# Patient Record
Sex: Female | Born: 1950 | Race: White | Marital: Married | Smoking: Former smoker
Health system: Southern US, Community
[De-identification: ages and names within clinical notes are randomized; demographics above are authoritative.]

## PROBLEM LIST (undated history)

## (undated) DIAGNOSIS — H269 Unspecified cataract: Secondary | ICD-10-CM

## (undated) DIAGNOSIS — E538 Deficiency of other specified B group vitamins: Secondary | ICD-10-CM

## (undated) DIAGNOSIS — J45909 Unspecified asthma, uncomplicated: Secondary | ICD-10-CM

## (undated) DIAGNOSIS — R7989 Other specified abnormal findings of blood chemistry: Secondary | ICD-10-CM

## (undated) DIAGNOSIS — K219 Gastro-esophageal reflux disease without esophagitis: Secondary | ICD-10-CM

## (undated) DIAGNOSIS — D649 Anemia, unspecified: Secondary | ICD-10-CM

## (undated) DIAGNOSIS — E119 Type 2 diabetes mellitus without complications: Secondary | ICD-10-CM

## (undated) DIAGNOSIS — E559 Vitamin D deficiency, unspecified: Secondary | ICD-10-CM

## (undated) DIAGNOSIS — G2581 Restless legs syndrome: Secondary | ICD-10-CM

## (undated) DIAGNOSIS — D509 Iron deficiency anemia, unspecified: Secondary | ICD-10-CM

## (undated) DIAGNOSIS — G250 Essential tremor: Secondary | ICD-10-CM

## (undated) DIAGNOSIS — I10 Essential (primary) hypertension: Secondary | ICD-10-CM

## (undated) DIAGNOSIS — Z87442 Personal history of urinary calculi: Secondary | ICD-10-CM

## (undated) DIAGNOSIS — G709 Myoneural disorder, unspecified: Secondary | ICD-10-CM

## (undated) DIAGNOSIS — E079 Disorder of thyroid, unspecified: Secondary | ICD-10-CM

## (undated) DIAGNOSIS — T7840XA Allergy, unspecified, initial encounter: Secondary | ICD-10-CM

## (undated) HISTORY — DX: Unspecified asthma, uncomplicated: J45.909

## (undated) HISTORY — DX: Essential (primary) hypertension: I10

## (undated) HISTORY — DX: Allergy, unspecified, initial encounter: T78.40XA

## (undated) HISTORY — DX: Gastro-esophageal reflux disease without esophagitis: K21.9

## (undated) HISTORY — DX: Disorder of thyroid, unspecified: E07.9

## (undated) HISTORY — PX: FOOT SURGERY: SHX648

## (undated) HISTORY — DX: Personal history of urinary calculi: Z87.442

## (undated) HISTORY — DX: Myoneural disorder, unspecified: G70.9

## (undated) HISTORY — DX: Essential tremor: G25.0

## (undated) HISTORY — DX: Deficiency of other specified B group vitamins: E53.8

## (undated) HISTORY — DX: Anemia, unspecified: D64.9

## (undated) HISTORY — DX: Vitamin D deficiency, unspecified: E55.9

## (undated) HISTORY — DX: Unspecified cataract: H26.9

## (undated) HISTORY — DX: Iron deficiency anemia, unspecified: D50.9

## (undated) HISTORY — DX: Type 2 diabetes mellitus without complications: E11.9

## (undated) HISTORY — PX: BREAST EXCISIONAL BIOPSY: SUR124

## (undated) HISTORY — DX: Other specified abnormal findings of blood chemistry: R79.89

## (undated) HISTORY — PX: CHOLECYSTECTOMY: SHX55

## (undated) HISTORY — PX: ABDOMINAL HYSTERECTOMY: SHX81

## (undated) HISTORY — DX: Restless legs syndrome: G25.81

---

## 2010-09-10 DEATH — deceased

## 2012-10-11 HISTORY — PX: GASTRIC BYPASS: SHX52

## 2014-05-31 DIAGNOSIS — R3129 Other microscopic hematuria: Secondary | ICD-10-CM | POA: Insufficient documentation

## 2014-05-31 DIAGNOSIS — D126 Benign neoplasm of colon, unspecified: Secondary | ICD-10-CM | POA: Insufficient documentation

## 2014-05-31 DIAGNOSIS — G2581 Restless legs syndrome: Secondary | ICD-10-CM | POA: Insufficient documentation

## 2014-05-31 DIAGNOSIS — H905 Unspecified sensorineural hearing loss: Secondary | ICD-10-CM | POA: Insufficient documentation

## 2014-05-31 DIAGNOSIS — E559 Vitamin D deficiency, unspecified: Secondary | ICD-10-CM | POA: Insufficient documentation

## 2014-05-31 DIAGNOSIS — K912 Postsurgical malabsorption, not elsewhere classified: Secondary | ICD-10-CM | POA: Insufficient documentation

## 2014-05-31 DIAGNOSIS — N3946 Mixed incontinence: Secondary | ICD-10-CM | POA: Insufficient documentation

## 2014-05-31 DIAGNOSIS — R931 Abnormal findings on diagnostic imaging of heart and coronary circulation: Secondary | ICD-10-CM | POA: Insufficient documentation

## 2014-05-31 DIAGNOSIS — Z9884 Bariatric surgery status: Secondary | ICD-10-CM | POA: Insufficient documentation

## 2014-05-31 DIAGNOSIS — N319 Neuromuscular dysfunction of bladder, unspecified: Secondary | ICD-10-CM | POA: Insufficient documentation

## 2014-05-31 DIAGNOSIS — K7689 Other specified diseases of liver: Secondary | ICD-10-CM | POA: Insufficient documentation

## 2014-05-31 DIAGNOSIS — N201 Calculus of ureter: Secondary | ICD-10-CM | POA: Insufficient documentation

## 2014-05-31 DIAGNOSIS — L900 Lichen sclerosus et atrophicus: Secondary | ICD-10-CM | POA: Insufficient documentation

## 2014-05-31 DIAGNOSIS — G4700 Insomnia, unspecified: Secondary | ICD-10-CM | POA: Insufficient documentation

## 2014-07-31 DIAGNOSIS — G250 Essential tremor: Secondary | ICD-10-CM | POA: Insufficient documentation

## 2014-09-26 DIAGNOSIS — K629 Disease of anus and rectum, unspecified: Secondary | ICD-10-CM | POA: Insufficient documentation

## 2014-09-26 DIAGNOSIS — R159 Full incontinence of feces: Secondary | ICD-10-CM | POA: Insufficient documentation

## 2014-09-26 DIAGNOSIS — M4714 Other spondylosis with myelopathy, thoracic region: Secondary | ICD-10-CM | POA: Insufficient documentation

## 2014-09-26 DIAGNOSIS — K6289 Other specified diseases of anus and rectum: Secondary | ICD-10-CM

## 2014-10-16 DIAGNOSIS — E538 Deficiency of other specified B group vitamins: Secondary | ICD-10-CM | POA: Insufficient documentation

## 2015-05-13 DIAGNOSIS — M7989 Other specified soft tissue disorders: Secondary | ICD-10-CM | POA: Insufficient documentation

## 2017-03-25 DIAGNOSIS — N952 Postmenopausal atrophic vaginitis: Secondary | ICD-10-CM | POA: Insufficient documentation

## 2017-04-07 DIAGNOSIS — M1710 Unilateral primary osteoarthritis, unspecified knee: Secondary | ICD-10-CM | POA: Insufficient documentation

## 2017-04-07 DIAGNOSIS — E211 Secondary hyperparathyroidism, not elsewhere classified: Secondary | ICD-10-CM

## 2017-04-07 DIAGNOSIS — M179 Osteoarthritis of knee, unspecified: Secondary | ICD-10-CM | POA: Insufficient documentation

## 2017-04-08 ENCOUNTER — Other Ambulatory Visit: Payer: Self-pay | Admitting: Internal Medicine

## 2017-04-08 DIAGNOSIS — R221 Localized swelling, mass and lump, neck: Secondary | ICD-10-CM

## 2017-04-21 ENCOUNTER — Ambulatory Visit
Admission: RE | Admit: 2017-04-21 | Discharge: 2017-04-21 | Disposition: A | Discharge status: Home / Self Care | Payer: BLUE CROSS/BLUE SHIELD | Source: Ambulatory Visit | Attending: Internal Medicine | Admitting: Internal Medicine

## 2017-04-21 DIAGNOSIS — R221 Localized swelling, mass and lump, neck: Secondary | ICD-10-CM | POA: Insufficient documentation

## 2017-04-21 DIAGNOSIS — I6529 Occlusion and stenosis of unspecified carotid artery: Secondary | ICD-10-CM | POA: Diagnosis not present

## 2017-04-21 MED ORDER — IOPAMIDOL (ISOVUE-300) INJECTION 61%
75.0000 mL | Freq: Once | INTRAVENOUS | Status: AC | PRN
  Administered 2017-04-21: 75 mL via INTRAVENOUS

## 2017-04-22 DIAGNOSIS — R413 Other amnesia: Secondary | ICD-10-CM | POA: Insufficient documentation

## 2017-06-13 DIAGNOSIS — N6019 Diffuse cystic mastopathy of unspecified breast: Secondary | ICD-10-CM | POA: Insufficient documentation

## 2017-06-13 DIAGNOSIS — E785 Hyperlipidemia, unspecified: Secondary | ICD-10-CM

## 2017-06-13 DIAGNOSIS — I10 Essential (primary) hypertension: Secondary | ICD-10-CM | POA: Insufficient documentation

## 2017-06-13 DIAGNOSIS — E1169 Type 2 diabetes mellitus with other specified complication: Secondary | ICD-10-CM | POA: Insufficient documentation

## 2017-08-29 DIAGNOSIS — R251 Tremor, unspecified: Secondary | ICD-10-CM | POA: Insufficient documentation

## 2017-09-09 DIAGNOSIS — M25569 Pain in unspecified knee: Secondary | ICD-10-CM | POA: Insufficient documentation

## 2017-09-09 DIAGNOSIS — M2240 Chondromalacia patellae, unspecified knee: Secondary | ICD-10-CM | POA: Insufficient documentation

## 2017-09-09 DIAGNOSIS — M189 Osteoarthritis of first carpometacarpal joint, unspecified: Secondary | ICD-10-CM | POA: Insufficient documentation

## 2017-09-09 DIAGNOSIS — G5600 Carpal tunnel syndrome, unspecified upper limb: Secondary | ICD-10-CM | POA: Insufficient documentation

## 2017-09-09 DIAGNOSIS — M23359 Other meniscus derangements, posterior horn of lateral meniscus, unspecified knee: Secondary | ICD-10-CM | POA: Insufficient documentation

## 2017-09-09 DIAGNOSIS — M23329 Other meniscus derangements, posterior horn of medial meniscus, unspecified knee: Secondary | ICD-10-CM | POA: Insufficient documentation

## 2017-09-09 DIAGNOSIS — M545 Low back pain, unspecified: Secondary | ICD-10-CM | POA: Insufficient documentation

## 2017-09-09 DIAGNOSIS — M1991 Primary osteoarthritis, unspecified site: Secondary | ICD-10-CM | POA: Insufficient documentation

## 2017-12-13 ENCOUNTER — Encounter: Payer: Self-pay | Admitting: Oncology

## 2017-12-13 ENCOUNTER — Inpatient Hospital Stay: Payer: BLUE CROSS/BLUE SHIELD | Attending: Oncology | Admitting: Oncology

## 2017-12-13 ENCOUNTER — Inpatient Hospital Stay: Payer: BLUE CROSS/BLUE SHIELD

## 2017-12-13 VITALS — BP 140/85 | HR 76 | Temp 97.5°F | Wt 176.0 lb

## 2017-12-13 DIAGNOSIS — D509 Iron deficiency anemia, unspecified: Secondary | ICD-10-CM

## 2017-12-13 DIAGNOSIS — R5383 Other fatigue: Secondary | ICD-10-CM | POA: Insufficient documentation

## 2017-12-13 DIAGNOSIS — R634 Abnormal weight loss: Secondary | ICD-10-CM | POA: Diagnosis not present

## 2017-12-13 DIAGNOSIS — E538 Deficiency of other specified B group vitamins: Secondary | ICD-10-CM | POA: Insufficient documentation

## 2017-12-13 DIAGNOSIS — D649 Anemia, unspecified: Secondary | ICD-10-CM

## 2017-12-13 HISTORY — DX: Iron deficiency anemia, unspecified: D50.9

## 2017-12-13 LAB — CBC WITH DIFFERENTIAL/PLATELET
Basophils Absolute: 0.1 10*3/uL (ref 0–0.1)
Basophils Relative: 2 %
Eosinophils Absolute: 0.3 10*3/uL (ref 0–0.7)
Eosinophils Relative: 7 %
HCT: 31.6 % — ABNORMAL LOW (ref 35.0–47.0)
HEMOGLOBIN: 9.8 g/dL — AB (ref 12.0–16.0)
LYMPHS PCT: 48 %
Lymphs Abs: 2 10*3/uL (ref 1.0–3.6)
MCH: 21 pg — ABNORMAL LOW (ref 26.0–34.0)
MCHC: 30.9 g/dL — ABNORMAL LOW (ref 32.0–36.0)
MCV: 67.8 fL — AB (ref 80.0–100.0)
Monocytes Absolute: 0.2 10*3/uL (ref 0.2–0.9)
Monocytes Relative: 5 %
NEUTROS PCT: 38 %
Neutro Abs: 1.6 10*3/uL (ref 1.4–6.5)
Platelets: 346 10*3/uL (ref 150–440)
RBC: 4.66 MIL/uL (ref 3.80–5.20)
RDW: 19 % — ABNORMAL HIGH (ref 11.5–14.5)
WBC: 4.3 10*3/uL (ref 3.6–11.0)

## 2017-12-13 LAB — COMPREHENSIVE METABOLIC PANEL
ALK PHOS: 103 U/L (ref 38–126)
ALT: 15 U/L (ref 14–54)
AST: 22 U/L (ref 15–41)
Albumin: 4.2 g/dL (ref 3.5–5.0)
Anion gap: 8 (ref 5–15)
BUN: 13 mg/dL (ref 6–20)
CO2: 22 mmol/L (ref 22–32)
CREATININE: 0.63 mg/dL (ref 0.44–1.00)
Calcium: 9.2 mg/dL (ref 8.9–10.3)
Chloride: 107 mmol/L (ref 101–111)
Glucose, Bld: 129 mg/dL — ABNORMAL HIGH (ref 65–99)
Potassium: 3.7 mmol/L (ref 3.5–5.1)
SODIUM: 137 mmol/L (ref 135–145)
Total Bilirubin: 0.3 mg/dL (ref 0.3–1.2)
Total Protein: 8.1 g/dL (ref 6.5–8.1)

## 2017-12-13 LAB — VITAMIN B12: Vitamin B-12: 350 pg/mL (ref 180–914)

## 2017-12-13 NOTE — Progress Notes (Signed)
Hematology/Oncology Consult note [facility] Telephone:([phone] [phone]   Patient Care Team: Adin Hector, MD as PCP - General (Internal Medicine)  REFERRING PROVIDER: Adin Hector, MD CHIEF COMPLAINTS/PURPOSE OF CONSULTATION:  Evaluation of iron deficiency anemia  HISTORY OF PRESENTING ILLNESS:  Carla Estrada is a  67 y.o.  female with PMH listed below who was referred to me for evaluation of iron deficiency anemia  Hemoglobin 9.2, MCV 70.5, WBC 5.6, platelet 358,000, basophil 0.11(H), eosinophil 7.7 (H). Ferritin was checked on 08/04/2018 and was low at 5, Patient can not tolerate oral iron supplementation. 12/01/2017 UA was negative for hematuria.  She reports that she has had colonoscopy done with [facility] and was told that she has precancerous process, (?polyps) and she was advised to follow-up in 5 years.  Denies seeing any blood in the stool.  Patient reports feeling extremely fatigued, shortness of breath, lack of energy.  Craves ice chips. 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  She does not know much of her family history because she was raised a child home. Positive for 4-5 pounds of weight loss lately.  No appetite.  Denies any abdominal pain She has a history of B12 deficiency and she get subcutaneous B12 shots prescribed by her primary care physician.  Review of Systems  Constitutional: Positive for malaise/fatigue and weight loss. Negative for chills and fever.  HENT: Negative for congestion and nosebleeds.   Eyes: Negative for pain.  Respiratory: Negative for cough, sputum production and shortness of breath.   Cardiovascular: Negative for chest pain and leg swelling.  Gastrointestinal: Positive for heartburn. Negative for abdominal pain, blood in stool, constipation, nausea and vomiting.  Genitourinary: Negative for dysuria.  Musculoskeletal: Negative for myalgias  and neck pain.  Skin: Negative for rash.  Neurological: Negative for dizziness and tingling.  Psychiatric/Behavioral: Negative for depression and substance abuse.    MEDICAL HISTORY:  Past Medical History:  Diagnosis Date  . Allergy   . Anemia   . Asthma   . Benign head tremor   . Diabetes mellitus without complication ([place])   . GERD (gastroesophageal reflux disease)   . History of kidney stones   . Hypertension   . Iron deficiency anemia 12/13/2017  . Low vitamin B12 level   . Neuromuscular disorder ([place])   . Restless legs   . Thyroid disease   . Vitamin D deficiency     SURGICAL HISTORY: Past Surgical History:  Procedure Laterality Date  . ABDOMINAL HYSTERECTOMY    . CHOLECYSTECTOMY    . FOOT SURGERY    . FOOT SURGERY    . GASTRIC BYPASS  2014    SOCIAL HISTORY: Social History   Socioeconomic History  . Marital status: Married    Spouse name: Not on file  . Number of children: Not on file  . Years of education: Not on file  . Highest education level: Not on file  Social Needs  . Financial resource strain: Not on file  . Food insecurity - worry: Not on file  . Food insecurity - inability: Not on file  . Transportation needs - medical: Not on file  . Transportation needs - non-medical: Not on file  Occupational History  . Not on file  Tobacco Use  . Smoking status: Former Smoker    Packs/day: 0.25    Years: 20.00    Pack years: 5.00  Types: Cigarettes  . Smokeless tobacco: Never Used  Substance and Sexual Activity  . Alcohol use: No    Frequency: Never  . Drug use: No  . Sexual activity: Not on file  Other Topics Concern  . Not on file  Social History Narrative  . Not on file    FAMILY HISTORY: Family History  Problem Relation Age of Onset  . Diabetes Mother   . Heart attack Father   . Heart attack Brother   . Breast cancer Maternal Aunt     ALLERGIES:  has no allergies on file.  MEDICATIONS:  Current Outpatient Medications    Medication Sig Dispense Refill  . albuterol (PROVENTIL HFA) 108 (90 Base) MCG/ACT inhaler Inhale into the lungs.    . betamethasone dipropionate (DIPROLENE) 0.05 % ointment betamethasone dipropionate 0.05 % topical ointment    . clonazePAM (KLONOPIN) 0.5 MG tablet Take 0.5 mg by mouth 3 (three) times daily as needed.    . Cyanocobalamin (B-12 COMPLIANCE INJECTION IJ) Inject 1,000 mcg into the muscle every 30 (thirty) days.    . Eszopiclone 3 MG TABS Take 3 mg by mouth daily as needed.    Marland Kitchen omeprazole (PRILOSEC) 20 MG capsule Take 20 mg by mouth as needed.    . pramipexole (MIRAPEX) 0.25 MG tablet Take 0.25 mg by mouth 3 (three) times daily.    . primidone (MYSOLINE) 50 MG tablet Take 50 mg by mouth 2 (two) times daily.    . promethazine (PHENERGAN) 25 MG tablet Take 25 mg by mouth as needed.    . Vitamin D, Ergocalciferol, (DRISDOL) 50000 units CAPS capsule Take 50,000 Units by mouth once a week.     No current facility-administered medications for this visit.      PHYSICAL EXAMINATION: ECOG PERFORMANCE STATUS: 1 - Symptomatic but completely ambulatory Vitals:   12/13/17 1017  BP: 140/85  Pulse: 76  Temp: (!) 97.5 F (36.4 C)   Filed Weights   12/13/17 1017  Weight: 176 lb (79.8 kg)    Physical Exam  Constitutional: She is oriented to person, place, and time and well-developed, well-nourished, and in no distress. No distress.  HENT:  Head: Normocephalic and atraumatic.  Mouth/Throat: No oropharyngeal exudate.  Eyes: EOM are normal. Pupils are equal, round, and reactive to light. Left eye exhibits no discharge. No scleral icterus.  Neck: Normal range of motion. Neck supple.  Cardiovascular: Normal rate and regular rhythm. Exam reveals no friction rub.  No murmur heard. Pulmonary/Chest: Effort normal and breath sounds normal. She has no wheezes. She has no rales.  Abdominal: Soft. Bowel sounds are normal. She exhibits no distension. There is no rebound.  Musculoskeletal:  Normal range of motion. She exhibits no edema.  Lymphadenopathy:    She has no cervical adenopathy.  Neurological: She is alert and oriented to person, place, and time. No cranial nerve deficit.  Skin: Skin is warm and dry. No erythema.  Psychiatric: Affect and judgment normal.     LABORATORY DATA:  I have reviewed the data as listed Lab Results  Component Value Date   WBC 4.3 12/13/2017   HGB 9.8 (L) 12/13/2017   HCT 31.6 (L) 12/13/2017   MCV 67.8 (L) 12/13/2017   PLT 346 12/13/2017   Recent Labs    12/13/17 1125  NA 137  K 3.7  CL 107  CO2 22  GLUCOSE 129*  BUN 13  CREATININE 0.63  CALCIUM 9.2  GFRNONAA >60  GFRAA >60  PROT 8.1  ALBUMIN  4.2  AST 22  ALT 15  ALKPHOS 103  BILITOT 0.3       ASSESSMENT & PLAN:  1. Iron deficiency anemia, unspecified iron deficiency anemia type   2. B12 deficiency   3. Weight loss   4. Fatigue associated with anemia    # Plan IV iron with Feraheme 510 mg weekly x2. Allergy reactions/infusion reaction including anaphylactic reaction discussed with patient. Patient voices understanding and willing to proceed.  # Check kidney and liver function, B12, celiac panel, intrinsic factor antibody, antiparietal antibody #Discussed with patient about the importance of GI workup given her iron deficiency.  Will refer to Dr. Vicente Males to discuss about need of repeat colonoscopy.  All questions were answered. The patient knows to call the clinic with any problems questions or concerns.  Return of visit: 4 weeks with repeat labs. Dr.Klein, thank you for this kind referral and the opportunity to participate in the care of this patient. A copy of today's note is routed to referring provider    Earlie Server, MD, PhD Hematology Oncology [facility] : [facility] Pager- [phone] 12/13/2017

## 2017-12-14 LAB — ANTI-PARIETAL ANTIBODY: Parietal Cell Antibody-IgG: 40.8 Units — ABNORMAL HIGH (ref 0.0–20.0)

## 2017-12-14 LAB — INTRINSIC FACTOR ANTIBODIES: INTRINSIC FACTOR: 1 [AU]/ml (ref 0.0–1.1)

## 2017-12-15 LAB — CELIAC PANEL 10
Antigliadin Abs, IgA: 4 units (ref 0–19)
ENDOMYSIAL ANTIBODY IGA: NEGATIVE
Gliadin IgG: 3 units (ref 0–19)
IGA: 252 mg/dL (ref 87–352)

## 2017-12-16 ENCOUNTER — Encounter: Payer: Self-pay | Admitting: Oncology

## 2017-12-19 ENCOUNTER — Other Ambulatory Visit: Payer: Self-pay | Admitting: Internal Medicine

## 2017-12-19 DIAGNOSIS — Z1231 Encounter for screening mammogram for malignant neoplasm of breast: Secondary | ICD-10-CM

## 2017-12-20 ENCOUNTER — Inpatient Hospital Stay: Payer: BLUE CROSS/BLUE SHIELD

## 2017-12-20 VITALS — BP 157/84 | HR 70 | Temp 96.1°F | Resp 18

## 2017-12-20 DIAGNOSIS — D509 Iron deficiency anemia, unspecified: Secondary | ICD-10-CM

## 2017-12-20 MED ORDER — SODIUM CHLORIDE 0.9 % IV SOLN
Freq: Once | INTRAVENOUS | Status: AC
  Administered 2017-12-20: 14:00:00 via INTRAVENOUS
  Filled 2017-12-20: qty 1000

## 2017-12-20 MED ORDER — FERUMOXYTOL INJECTION 510 MG/17 ML
510.0000 mg | Freq: Once | INTRAVENOUS | Status: AC
  Administered 2017-12-20: 510 mg via INTRAVENOUS
  Filled 2017-12-20: qty 17

## 2017-12-27 ENCOUNTER — Inpatient Hospital Stay: Payer: BLUE CROSS/BLUE SHIELD

## 2017-12-27 VITALS — BP 132/77 | HR 83 | Temp 96.9°F | Resp 18

## 2017-12-27 DIAGNOSIS — D509 Iron deficiency anemia, unspecified: Secondary | ICD-10-CM

## 2017-12-27 MED ORDER — SODIUM CHLORIDE 0.9 % IV SOLN
Freq: Once | INTRAVENOUS | Status: AC
  Administered 2017-12-27: 14:00:00 via INTRAVENOUS
  Filled 2017-12-27: qty 1000

## 2017-12-27 MED ORDER — FERUMOXYTOL INJECTION 510 MG/17 ML
510.0000 mg | Freq: Once | INTRAVENOUS | Status: AC
  Administered 2017-12-27: 510 mg via INTRAVENOUS
  Filled 2017-12-27: qty 17

## 2017-12-28 ENCOUNTER — Encounter: Payer: Self-pay | Admitting: Gastroenterology

## 2017-12-28 ENCOUNTER — Ambulatory Visit (INDEPENDENT_AMBULATORY_CARE_PROVIDER_SITE_OTHER): Payer: BLUE CROSS/BLUE SHIELD | Admitting: Gastroenterology

## 2017-12-28 VITALS — BP 148/83 | HR 73 | Ht 62.5 in | Wt 180.2 lb

## 2017-12-28 DIAGNOSIS — D509 Iron deficiency anemia, unspecified: Secondary | ICD-10-CM

## 2017-12-28 DIAGNOSIS — E119 Type 2 diabetes mellitus without complications: Secondary | ICD-10-CM | POA: Insufficient documentation

## 2017-12-28 DIAGNOSIS — IMO0001 Reserved for inherently not codable concepts without codable children: Secondary | ICD-10-CM | POA: Insufficient documentation

## 2017-12-28 NOTE — Progress Notes (Signed)
Jonathon Bellows MD, MRCP(U.K) [address]  [place]  [place], [place] [address]  Main: [phone]  Fax: [phone]   Gastroenterology Consultation  Referring Provider:     Earlie Server, MD Primary Care Physician:  Adin Hector, MD Primary Gastroenterologist:  Dr. Jonathon Bellows  Reason for Consultation:     Iron deficiency anemia         HPI:   Carla Estrada is a 67 y.o. y/o female referred for iron deficiency anemia. She has been referred by Dr Tasia Catchings. MCV 70 with a ferritin in 07/2017 of 5, receiving IV iron . Last colonoscopy was told she had polyps. She is also b12 deficienct.  Celiac serology negative. Anti parietal cell antibody positive. She says that she has had anemia since she was a child.  She has had a gastric bypass- Roux en y. In 2011 Hb was 13. 2. No blood in urine in 11/2017 .  Rectal bleeding: no  Nose bleeds: no  Vaginal bleeding : no  Hematemesis or hemoptysis : no  Blood in urine : no   Last colonoscopy was 4 years back - "always had polyps" . Not raised with her family and hence no family history available.  CBC Latest Ref Rng & Units 12/13/2017  WBC 3.6 - 11.0 K/uL 4.3  Hemoglobin 12.0 - 16.0 g/dL 9.8(L)  Hematocrit 35.0 - 47.0 % 31.6(L)  Platelets 150 - 440 K/uL 346     Past Medical History:  Diagnosis Date  . Allergy   . Anemia   . Asthma   . Benign head tremor   . Diabetes mellitus without complication ([place])   . GERD (gastroesophageal reflux disease)   . History of kidney stones   . Hypertension   . Iron deficiency anemia 12/13/2017  . Low vitamin B12 level   . Neuromuscular disorder ([place])   . Restless legs   . Thyroid disease   . Vitamin D deficiency     Past Surgical History:  Procedure Laterality Date  . ABDOMINAL HYSTERECTOMY    . CHOLECYSTECTOMY    . FOOT SURGERY    . FOOT SURGERY    . GASTRIC BYPASS  2014    Prior to Admission medications   Medication Sig Start Date End Date Taking? Authorizing Provider  albuterol  (PROVENTIL HFA) 108 (90 Base) MCG/ACT inhaler Inhale into the lungs. 12/06/17   [provider]  betamethasone dipropionate (DIPROLENE) 0.05 % ointment betamethasone dipropionate 0.05 % topical ointment 11/10/16   [provider]  clonazePAM (KLONOPIN) 0.5 MG tablet Take 0.5 mg by mouth 3 (three) times daily as needed. 07/04/17   [provider]  Cyanocobalamin (B-12 COMPLIANCE INJECTION IJ) Inject 1,000 mcg into the muscle every 30 (thirty) days. 11/10/16   [provider]  Eszopiclone 3 MG TABS Take 3 mg by mouth daily as needed. 10/05/17 10/05/18  [provider]  omeprazole (PRILOSEC) 20 MG capsule Take 20 mg by mouth as needed. 01/10/17   [provider]  pramipexole (MIRAPEX) 0.25 MG tablet Take 0.25 mg by mouth 3 (three) times daily. 07/15/17   [provider]  primidone (MYSOLINE) 50 MG tablet Take 50 mg by mouth 2 (two) times daily. 11/10/16   [provider]  promethazine (PHENERGAN) 25 MG tablet Take 25 mg by mouth as needed. 11/10/16   [provider]  Vitamin D, Ergocalciferol, (DRISDOL) 50000 units CAPS capsule Take 50,000 Units by mouth once a week. 03/15/17 03/15/18  [provider]  Family History  Problem Relation Age of Onset  . Diabetes Mother   . Heart attack Father   . Heart attack Brother   . Breast cancer Maternal Aunt      Social History   Tobacco Use  . Smoking status: Former Smoker    Packs/day: 0.25    Years: 20.00    Pack years: 5.00    Types: Cigarettes  . Smokeless tobacco: Never Used  Substance Use Topics  . Alcohol use: No    Frequency: Never  . Drug use: No    Allergies as of 12/28/2017 - Review Complete 12/13/2017  Allergen Reaction Noted  . Oxycodone-acetaminophen Nausea And Vomiting 02/01/2011  . Liraglutide Nausea And Vomiting 02/01/2011  . Other Nausea And Vomiting 02/01/2011  . Gabapentin Other (See Comments) 09/09/2017  . Exenatide Nausea Only and Nausea And  Vomiting 05/27/2014  . Zoledronic acid Nausea Only 09/26/2014    Review of Systems:    All systems reviewed and negative except where noted in HPI.   Physical Exam:  BP (!) 148/83 (BP Location: Left Arm, Patient Position: Sitting, Cuff Size: Large)   Pulse 73   Ht 5' 2.5" (1.588 m)   Wt 180 lb 3.2 oz (81.7 kg)   BMI 32.43 kg/m  No LMP recorded. Psych:  Alert and cooperative. Normal mood and affect. General:   Alert,  Well-developed, well-nourished, pleasant and cooperative in NAD Head:  Normocephalic and atraumatic. Eyes:  Sclera clear, no icterus.   Conjunctiva pink. Ears:  Normal auditory acuity. Nose:  No deformity, discharge, or lesions. Mouth:  No deformity or lesions,oropharynx pink & moist. Neck:  Supple; no masses or thyromegaly. Lungs:  Respirations even and unlabored.  Clear throughout to auscultation.   No wheezes, crackles, or rhonchi. No acute distress. Heart:  Regular rate and rhythm; no murmurs, clicks, rubs, or gallops. Abdomen:  Normal bowel sounds.  No bruits.  Soft, non-tender and non-distended without masses, hepatosplenomegaly or hernias noted.  No guarding or rebound tenderness.    Msk:  Symmetrical without gross deformities. Good, equal movement & strength bilaterally. Pulses:  Normal pulses noted. Extremities:  No clubbing or edema.  No cyanosis. Neurologic:  Alert and oriented x3;  grossly normal neurologically. Skin:  Intact without significant lesions or rashes. No jaundice. Lymph Nodes:  No significant cervical adenopathy. Psych:  Alert and cooperative. Normal mood and affect.  Imaging Studies: No results found.  Assessment and Plan:   Carla Estrada is a 67 y.o. y/o female has been referred for iron deficiency anemia. 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    Plan  1. EGD+colonoscopy+/- capsule study    I have discussed alternative options, risks & benefits,  which include, but are not limited to, bleeding,  infection, perforation,respiratory complication & drug reaction.  The patient agrees with this plan & written consent will be obtained.     Follow up in 8 weeks   Dr Jonathon Bellows MD,MRCP(U.K)

## 2017-12-28 NOTE — Addendum Note (Signed)
Addended by: Leontine Locket Z on: 12/28/2017 11:40 AM   Modules accepted: Orders, SmartSet

## 2018-01-03 ENCOUNTER — Encounter: Payer: Self-pay | Admitting: Radiology

## 2018-01-03 ENCOUNTER — Ambulatory Visit
Admission: RE | Admit: 2018-01-03 | Discharge: 2018-01-03 | Disposition: A | Discharge status: Home / Self Care | Payer: BLUE CROSS/BLUE SHIELD | Source: Ambulatory Visit | Attending: Internal Medicine | Admitting: Internal Medicine

## 2018-01-03 DIAGNOSIS — Z1231 Encounter for screening mammogram for malignant neoplasm of breast: Secondary | ICD-10-CM | POA: Insufficient documentation

## 2018-01-04 ENCOUNTER — Encounter: Payer: Self-pay | Admitting: *Deleted

## 2018-01-05 ENCOUNTER — Encounter: Payer: Self-pay | Admitting: Anesthesiology

## 2018-01-05 ENCOUNTER — Encounter
Admission: RE | Disposition: A | Discharge status: Home / Self Care | Payer: Self-pay | Source: Ambulatory Visit | Attending: Gastroenterology

## 2018-01-05 ENCOUNTER — Ambulatory Visit: Payer: BLUE CROSS/BLUE SHIELD | Admitting: Anesthesiology

## 2018-01-05 ENCOUNTER — Ambulatory Visit
Admission: RE | Admit: 2018-01-05 | Discharge: 2018-01-05 | Disposition: A | Discharge status: Home / Self Care | Payer: BLUE CROSS/BLUE SHIELD | Source: Ambulatory Visit | Attending: Gastroenterology | Admitting: Gastroenterology

## 2018-01-05 DIAGNOSIS — D509 Iron deficiency anemia, unspecified: Secondary | ICD-10-CM | POA: Insufficient documentation

## 2018-01-05 DIAGNOSIS — E119 Type 2 diabetes mellitus without complications: Secondary | ICD-10-CM | POA: Insufficient documentation

## 2018-01-05 DIAGNOSIS — E079 Disorder of thyroid, unspecified: Secondary | ICD-10-CM | POA: Insufficient documentation

## 2018-01-05 DIAGNOSIS — Z87891 Personal history of nicotine dependence: Secondary | ICD-10-CM | POA: Diagnosis not present

## 2018-01-05 DIAGNOSIS — K210 Gastro-esophageal reflux disease with esophagitis: Secondary | ICD-10-CM | POA: Insufficient documentation

## 2018-01-05 DIAGNOSIS — J45909 Unspecified asthma, uncomplicated: Secondary | ICD-10-CM | POA: Diagnosis not present

## 2018-01-05 DIAGNOSIS — D123 Benign neoplasm of transverse colon: Secondary | ICD-10-CM

## 2018-01-05 DIAGNOSIS — I10 Essential (primary) hypertension: Secondary | ICD-10-CM | POA: Diagnosis not present

## 2018-01-05 DIAGNOSIS — Z9884 Bariatric surgery status: Secondary | ICD-10-CM | POA: Diagnosis not present

## 2018-01-05 DIAGNOSIS — G2581 Restless legs syndrome: Secondary | ICD-10-CM | POA: Insufficient documentation

## 2018-01-05 DIAGNOSIS — E559 Vitamin D deficiency, unspecified: Secondary | ICD-10-CM | POA: Diagnosis not present

## 2018-01-05 DIAGNOSIS — Z87442 Personal history of urinary calculi: Secondary | ICD-10-CM | POA: Insufficient documentation

## 2018-01-05 DIAGNOSIS — Z79899 Other long term (current) drug therapy: Secondary | ICD-10-CM | POA: Insufficient documentation

## 2018-01-05 DIAGNOSIS — D124 Benign neoplasm of descending colon: Secondary | ICD-10-CM | POA: Insufficient documentation

## 2018-01-05 HISTORY — PX: COLONOSCOPY WITH PROPOFOL: SHX5780

## 2018-01-05 HISTORY — PX: ESOPHAGOGASTRODUODENOSCOPY (EGD) WITH PROPOFOL: SHX5813

## 2018-01-05 SURGERY — ESOPHAGOGASTRODUODENOSCOPY (EGD) WITH PROPOFOL
Anesthesia: General

## 2018-01-05 MED ORDER — FENTANYL CITRATE (PF) 100 MCG/2ML IJ SOLN
INTRAMUSCULAR | Status: AC
  Filled 2018-01-05: qty 2

## 2018-01-05 MED ORDER — PROPOFOL 10 MG/ML IV BOLUS
INTRAVENOUS | Status: AC
  Filled 2018-01-05: qty 20

## 2018-01-05 MED ORDER — SODIUM CHLORIDE 0.9 % IV SOLN
INTRAVENOUS | Status: DC
  Administered 2018-01-05: 1000 mL via INTRAVENOUS

## 2018-01-05 MED ORDER — FENTANYL CITRATE (PF) 100 MCG/2ML IJ SOLN
INTRAMUSCULAR | Status: DC | PRN
  Administered 2018-01-05: 50 ug via INTRAVENOUS

## 2018-01-05 MED ORDER — PROPOFOL 10 MG/ML IV BOLUS
INTRAVENOUS | Status: AC
  Filled 2018-01-05: qty 40

## 2018-01-05 MED ORDER — PROPOFOL 10 MG/ML IV BOLUS
INTRAVENOUS | Status: DC | PRN
  Administered 2018-01-05: 100 mg via INTRAVENOUS

## 2018-01-05 MED ORDER — PROPOFOL 500 MG/50ML IV EMUL
INTRAVENOUS | Status: DC | PRN
  Administered 2018-01-05: 125 ug/kg/min via INTRAVENOUS

## 2018-01-05 NOTE — Anesthesia Postprocedure Evaluation (Signed)
Anesthesia Post Note  Patient: Carla Estrada  Procedure(s) Performed: ESOPHAGOGASTRODUODENOSCOPY (EGD) WITH PROPOFOL (N/A ) COLONOSCOPY WITH PROPOFOL (N/A )  Patient location during evaluation: Endoscopy Anesthesia Type: General Level of consciousness: awake and alert Pain management: pain level controlled Vital Signs Assessment: post-procedure vital signs reviewed and stable Respiratory status: spontaneous breathing, nonlabored ventilation, respiratory function stable and patient connected to nasal cannula oxygen Cardiovascular status: blood pressure returned to baseline and stable Postop Assessment: no apparent nausea or vomiting Anesthetic complications: no     Last Vitals:  Vitals:   01/05/18 1119 01/05/18 1129  BP: (!) 83/56   Pulse: 69 68  Resp: 18 14  Temp: (!) 36.2 C   SpO2: 96% 99%    Last Pain:  Vitals:   01/05/18 1139  TempSrc:   PainSc: 0-No pain                 Kiva Norland S

## 2018-01-05 NOTE — Op Note (Signed)
[facility] Gastroenterology Patient Name: Carla Estrada Procedure Date: 01/05/2018 10:42 AM MRN: [id number] Account #: [id number] Date of Birth: November 15, 1950 Admit Type: Outpatient Age: 67 Room: [facility] ENDO ROOM 4 Gender: Female Note Status: Finalized Procedure:            Colonoscopy Indications:          Iron deficiency anemia Providers:            Jonathon Bellows MD, MD Referring MD:         Ramonita Lab, MD (Referring MD) Medicines:            Monitored Anesthesia Care Complications:        No immediate complications. Procedure:            Pre-Anesthesia Assessment:                       - Prior to the procedure, a History and Physical was                        performed, and patient medications, allergies and                        sensitivities were reviewed. The patient's tolerance of                        previous anesthesia was reviewed.                       - The risks and benefits of the procedure and the                        sedation options and risks were discussed with the                        patient. All questions were answered and informed                        consent was obtained.                       - ASA Grade Assessment: III - A patient with severe                        systemic disease.                       After obtaining informed consent, the colonoscope was                        passed under direct vision. Throughout the procedure,                        the patient's blood pressure, pulse, and oxygen                        saturations were monitored continuously. The                        Colonoscope was introduced through the anus and                        advanced  to the the cecum, identified by the                        appendiceal orifice, IC valve and transillumination.                        The colonoscopy was performed with ease. The patient                        tolerated the procedure well. The quality of the bowel                    preparation was good. Findings:      The perianal and digital rectal examinations were normal.      Four sessile polyps were found in the descending colon and transverse       colon. The polyps were 3 to 4 mm in size. These polyps were removed with       a cold biopsy forceps. Resection and retrieval were complete. 3 polyps       in the descending colon and one in the transverse colon      The exam was otherwise without abnormality on direct and retroflexion       views. Impression:           - Four 3 to 4 mm polyps in the descending colon and in                        the transverse colon, removed with a cold biopsy                        forceps. Resected and retrieved.                       - The examination was otherwise normal on direct and                        retroflexion views. Recommendation:       - Discharge patient to home (with escort).                       - Resume previous diet.                       - Continue present medications.                       - Await pathology results.                       - Repeat colonoscopy in 3 years for surveillance.                       - To visualize the small bowel, perform video capsule                        endoscopy in 2 weeks.                       - Return to my office as previously scheduled. Procedure Code(s):    --- Professional ---  45380, Colonoscopy, flexible; with biopsy, single or                        multiple Diagnosis Code(s):    --- Professional ---                       D12.4, Benign neoplasm of descending colon                       D12.3, Benign neoplasm of transverse colon (hepatic                        flexure or splenic flexure)                       D50.9, Iron deficiency anemia, unspecified CPT copyright 2016 American Medical Association. All rights reserved. The codes documented in this report are preliminary and upon coder review may  be revised to meet current  compliance requirements. Jonathon Bellows, MD Jonathon Bellows MD, MD 01/05/2018 11:18:19 AM This report has been signed electronically. Number of Addenda: 0 Note Initiated On: 01/05/2018 10:42 AM Scope Withdrawal Time: 0 hours 13 minutes 24 seconds  Total Procedure Duration: 0 hours 17 minutes 38 seconds       [facility]

## 2018-01-05 NOTE — Anesthesia Preprocedure Evaluation (Signed)
Anesthesia Evaluation  Patient identified by MRN, date of birth, ID band Patient awake    Reviewed: Allergy & Precautions, NPO status , Patient's Chart, lab work & pertinent test results, reviewed documented beta blocker date and time   Airway Mallampati: II  TM Distance: >3 FB     Dental  (+) Chipped   Pulmonary asthma , former smoker,           Cardiovascular hypertension, Pt. on medications      Neuro/Psych  Neuromuscular disease    GI/Hepatic GERD  ,  Endo/Other  diabetes, Type 2  Renal/GU      Musculoskeletal  (+) Arthritis ,   Abdominal   Peds  Hematology  (+) anemia ,   Anesthesia Other Findings   Reproductive/Obstetrics                             Anesthesia Physical Anesthesia Plan  ASA: III  Anesthesia Plan: General   Post-op Pain Management:    Induction: Intravenous  PONV Risk Score and Plan:   Airway Management Planned:   Additional Equipment:   Intra-op Plan:   Post-operative Plan:   Informed Consent: I have reviewed the patients History and Physical, chart, labs and discussed the procedure including the risks, benefits and alternatives for the proposed anesthesia with the patient or authorized representative who has indicated his/her understanding and acceptance.     Plan Discussed with: CRNA  Anesthesia Plan Comments:         Anesthesia Quick Evaluation

## 2018-01-05 NOTE — Transfer of Care (Signed)
Immediate Anesthesia Transfer of Care Note  Patient: Carla Estrada  Procedure(s) Performed: ESOPHAGOGASTRODUODENOSCOPY (EGD) WITH PROPOFOL (N/A ) COLONOSCOPY WITH PROPOFOL (N/A )  Patient Location: PACU  Anesthesia Type:General  Level of Consciousness: awake and alert   Airway & Oxygen Therapy: Patient Spontanous Breathing  Post-op Assessment: Report given to RN  Post vital signs: Reviewed and stable  Last Vitals:  Vitals Value Taken Time  BP    Temp    Pulse 69 01/05/2018 11:19 AM  Resp 21 01/05/2018 11:19 AM  SpO2 96 % 01/05/2018 11:19 AM  Vitals shown include unvalidated device data.  Last Pain:  Vitals:   01/05/18 0920  TempSrc: Tympanic  PainSc: 0-No pain         Complications: No apparent anesthesia complications

## 2018-01-05 NOTE — H&P (Signed)
Carla Bellows, MD [address], [place], [place], [place], [address] [address], [place], [place], [place], [address] Phone: [phone]  Fax: [phone]  Primary Care Physician:  Adin Hector, MD   Pre-Procedure History & Physical: HPI:  Carla Estrada is a 67 y.o. female is here for an endoscopy and colonoscopy    Past Medical History:  Diagnosis Date  . Allergy   . Anemia   . Asthma   . Benign head tremor   . Diabetes mellitus without complication ([place])   . GERD (gastroesophageal reflux disease)   . History of kidney stones   . Hypertension   . Iron deficiency anemia 12/13/2017  . Low vitamin B12 level   . Neuromuscular disorder ([place])   . Restless legs   . Thyroid disease   . Vitamin D deficiency     Past Surgical History:  Procedure Laterality Date  . ABDOMINAL HYSTERECTOMY    . BREAST EXCISIONAL BIOPSY Left   . CHOLECYSTECTOMY    . FOOT SURGERY    . FOOT SURGERY    . GASTRIC BYPASS  2014    Prior to Admission medications   Medication Sig Start Date End Date Taking? Authorizing Provider  albuterol (PROVENTIL HFA) 108 (90 Base) MCG/ACT inhaler Inhale into the lungs. 12/06/17  Yes [provider]  betamethasone dipropionate (DIPROLENE) 0.05 % ointment betamethasone dipropionate 0.05 % topical ointment 11/10/16  Yes [provider]  clonazePAM (KLONOPIN) 0.5 MG tablet Take 0.5 mg by mouth 3 (three) times daily as needed. 07/04/17  Yes [provider]  Cyanocobalamin (B-12 COMPLIANCE INJECTION IJ) Inject 1,000 mcg into the muscle every 30 (thirty) days. 11/10/16  Yes [provider]  Eszopiclone 3 MG TABS Take 3 mg by mouth daily as needed. 10/05/17 10/05/18 Yes [provider]  omeprazole (PRILOSEC) 20 MG capsule Take 20 mg by mouth as needed. 01/10/17  Yes [provider]  pramipexole (MIRAPEX) 0.25 MG tablet Take 0.25 mg by mouth 3 (three) times daily. 07/15/17  Yes [provider]  primidone  (MYSOLINE) 50 MG tablet Take 50 mg by mouth 2 (two) times daily. 11/10/16  Yes [provider]  promethazine (PHENERGAN) 25 MG tablet Take 25 mg by mouth as needed. 11/10/16  Yes [provider]  Vitamin D, Ergocalciferol, (DRISDOL) 50000 units CAPS capsule Take 50,000 Units by mouth once a week. 03/15/17 03/15/18 Yes [provider]    Allergies as of 12/29/2017 - Review Complete 12/28/2017  Allergen Reaction Noted  . Oxycodone-acetaminophen Nausea And Vomiting 02/01/2011  . Liraglutide Nausea And Vomiting 02/01/2011  . Other Nausea And Vomiting 02/01/2011  . Gabapentin Other (See Comments) 09/09/2017  . Exenatide Nausea Only and Nausea And Vomiting 05/27/2014  . Zoledronic acid Nausea Only 09/26/2014    Family History  Problem Relation Age of Onset  . Diabetes Mother   . Heart attack Father   . Heart attack Brother   . Breast cancer Paternal Aunt     Social History   Socioeconomic History  . Marital status: Married    Spouse name: Not on file  . Number of children: Not on file  . Years of education: Not on file  . Highest education level: Not on file  Occupational History  . Not on file  Social Needs  . Financial resource strain: Not on file  . Food insecurity:    Worry: Not on file    Inability: Not on file  . Transportation  needs:    Medical: Not on file    Non-medical: Not on file  Tobacco Use  . Smoking status: Former Smoker    Packs/day: 0.25    Years: 20.00    Pack years: 5.00    Types: Cigarettes  . Smokeless tobacco: Never Used  Substance and Sexual Activity  . Alcohol use: No    Frequency: Never  . Drug use: No  . Sexual activity: Yes  Lifestyle  . Physical activity:    Days per week: Not on file    Minutes per session: Not on file  . Stress: Not on file  Relationships  . Social connections:    Talks on phone: Not on file    Gets together: Not on file    Attends religious service: Not on file    Active member of club or  organization: Not on file    Attends meetings of clubs or organizations: Not on file    Relationship status: Not on file  . Intimate partner violence:    Fear of current or ex partner: Not on file    Emotionally abused: Not on file    Physically abused: Not on file    Forced sexual activity: Not on file  Other Topics Concern  . Not on file  Social History Narrative  . Not on file    Review of Systems: See HPI, otherwise negative ROS  Physical Exam: BP (!) 181/87   Pulse 69   Temp (!) 97 F (36.1 C) (Tympanic)   Resp 20   Ht 5' 2.5" (1.588 m)   Wt 176 lb (79.8 kg)   SpO2 100%   BMI 31.68 kg/m  General:   Alert,  pleasant and cooperative in NAD Head:  Normocephalic and atraumatic. Neck:  Supple; no masses or thyromegaly. Lungs:  Clear throughout to auscultation, normal respiratory effort.    Heart:  +S1, +S2, Regular rate and rhythm, No edema. Abdomen:  Soft, nontender and nondistended. Normal bowel sounds, without guarding, and without rebound.   Neurologic:  Alert and  oriented x4;  grossly normal neurologically.  Impression/Plan: Carla Estrada is here for an endoscopy and colonoscopy  to be performed for  evaluation of iron deficiency anemia     Risks, benefits, limitations, and alternatives regarding endoscopy have been reviewed with the patient.  Questions have been answered.  All parties agreeable.   Carla Bellows, MD  01/05/2018, 10:38 AM

## 2018-01-05 NOTE — Op Note (Signed)
[facility] Gastroenterology Patient Name: Carla Estrada Procedure Date: 01/05/2018 10:42 AM MRN: [id number] Account #: [id number] Date of Birth: 11-19-1950 Admit Type: Outpatient Age: 67 Room: [facility] ENDO ROOM 4 Gender: Female Note Status: Finalized Procedure:            Upper GI endoscopy Indications:          Iron deficiency anemia Providers:            Jonathon Bellows MD, MD Referring MD:         Ramonita Lab, MD (Referring MD) Medicines:            Monitored Anesthesia Care Complications:        No immediate complications. Procedure:            Pre-Anesthesia Assessment:                       - Prior to the procedure, a History and Physical was                        performed, and patient medications, allergies and                        sensitivities were reviewed. The patient's tolerance of                        previous anesthesia was reviewed.                       - The risks and benefits of the procedure and the                        sedation options and risks were discussed with the                        patient. All questions were answered and informed                        consent was obtained.                       - ASA Grade Assessment: III - A patient with severe                        systemic disease.                       After obtaining informed consent, the endoscope was                        passed under direct vision. Throughout the procedure,                        the patient's blood pressure, pulse, and oxygen                        saturations were monitored continuously. The Endoscope                        was introduced through the mouth, and advanced to the  jejunum. The upper GI endoscopy was accomplished with                        ease. The patient tolerated the procedure well. Findings:      The examined jejunum was normal.      Evidence of a gastric bypass was found. A gastric pouch was found. The   gastrojejunal anastomosis was characterized by healthy appearing mucosa.       This was traversed. The pouch-to-jejunum limb was characterized by       healthy appearing mucosa. The duodenum-to-jejunum limb was examined.      LA Grade A (one or more mucosal breaks less than 5 mm, not extending       between tops of 2 mucosal folds) esophagitis with no bleeding was found       at the gastroesophageal junction. Biopsies were taken with a cold       forceps for histology.      The cardia and gastric fundus were normal on retroflexion. Impression:           - Normal examined jejunum.                       - Gastric bypass. Gastrojejunal anastomosis                        characterized by healthy appearing mucosa.                       - LA Grade A reflux esophagitis. Biopsied. Recommendation:       - Await pathology results.                       - Perform a colonoscopy today. Procedure Code(s):    --- Professional ---                       [phone], Esophagogastroduodenoscopy, flexible, transoral;                        with biopsy, single or multiple Diagnosis Code(s):    --- Professional ---                       Z98.84, Bariatric surgery status                       K21.0, Gastro-esophageal reflux disease with esophagitis                       D50.9, Iron deficiency anemia, unspecified CPT copyright 2016 American Medical Association. All rights reserved. The codes documented in this report are preliminary and upon coder review may  be revised to meet current compliance requirements. Jonathon Bellows, MD Jonathon Bellows MD, MD 01/05/2018 10:56:15 AM This report has been signed electronically. Number of Addenda: 0 Note Initiated On: 01/05/2018 10:42 AM      [facility]

## 2018-01-05 NOTE — Anesthesia Post-op Follow-up Note (Signed)
Anesthesia QCDR form completed.        

## 2018-01-06 LAB — SURGICAL PATHOLOGY

## 2018-01-09 ENCOUNTER — Encounter: Payer: Self-pay | Admitting: Gastroenterology

## 2018-01-10 ENCOUNTER — Inpatient Hospital Stay
Admission: RE | Admit: 2018-01-10 | Discharge: 2018-01-10 | Disposition: A | Discharge status: Home / Self Care | Payer: Self-pay | Source: Ambulatory Visit | Attending: *Deleted | Admitting: *Deleted

## 2018-01-10 ENCOUNTER — Other Ambulatory Visit: Payer: Self-pay | Admitting: *Deleted

## 2018-01-10 DIAGNOSIS — Z9289 Personal history of other medical treatment: Secondary | ICD-10-CM

## 2018-01-11 ENCOUNTER — Inpatient Hospital Stay (HOSPITAL_BASED_OUTPATIENT_CLINIC_OR_DEPARTMENT_OTHER): Payer: BLUE CROSS/BLUE SHIELD | Admitting: Oncology

## 2018-01-11 ENCOUNTER — Encounter: Payer: Self-pay | Admitting: Oncology

## 2018-01-11 ENCOUNTER — Inpatient Hospital Stay: Payer: BLUE CROSS/BLUE SHIELD | Attending: Oncology

## 2018-01-11 VITALS — BP 145/85 | HR 80 | Temp 98.3°F | Resp 18 | Wt 179.0 lb

## 2018-01-11 DIAGNOSIS — K210 Gastro-esophageal reflux disease with esophagitis: Secondary | ICD-10-CM | POA: Insufficient documentation

## 2018-01-11 DIAGNOSIS — D509 Iron deficiency anemia, unspecified: Secondary | ICD-10-CM

## 2018-01-11 DIAGNOSIS — E538 Deficiency of other specified B group vitamins: Secondary | ICD-10-CM | POA: Insufficient documentation

## 2018-01-11 DIAGNOSIS — Z9484 Stem cells transplant status: Secondary | ICD-10-CM

## 2018-01-11 DIAGNOSIS — D508 Other iron deficiency anemias: Secondary | ICD-10-CM | POA: Insufficient documentation

## 2018-01-11 LAB — CBC WITH DIFFERENTIAL/PLATELET
BASOS ABS: 0.1 10*3/uL (ref 0–0.1)
BASOS PCT: 1 %
EOS PCT: 2 %
Eosinophils Absolute: 0.1 10*3/uL (ref 0–0.7)
HCT: 38.2 % (ref 35.0–47.0)
Hemoglobin: 12.8 g/dL (ref 12.0–16.0)
LYMPHS PCT: 41 %
Lymphs Abs: 2.2 10*3/uL (ref 1.0–3.6)
MCH: 25.3 pg — ABNORMAL LOW (ref 26.0–34.0)
MCHC: 33.5 g/dL (ref 32.0–36.0)
MCV: 75.5 fL — AB (ref 80.0–100.0)
Monocytes Absolute: 0.3 10*3/uL (ref 0.2–0.9)
Monocytes Relative: 5 %
Neutro Abs: 2.8 10*3/uL (ref 1.4–6.5)
Neutrophils Relative %: 51 %
PLATELETS: 363 10*3/uL (ref 150–440)
RBC: 5.06 MIL/uL (ref 3.80–5.20)
RDW: 30.3 % — ABNORMAL HIGH (ref 11.5–14.5)
WBC: 5.5 10*3/uL (ref 3.6–11.0)

## 2018-01-11 LAB — IRON AND TIBC
Iron: 72 ug/dL (ref 28–170)
Saturation Ratios: 19 % (ref 10.4–31.8)
TIBC: 382 ug/dL (ref 250–450)
UIBC: 310 ug/dL

## 2018-01-11 LAB — FERRITIN: Ferritin: 207 ng/mL (ref 11–307)

## 2018-01-11 NOTE — Progress Notes (Signed)
Pt in for follow up and labs today.  Pt reports "feeling very good, not tired or fatigued like I was before the treatments".

## 2018-01-11 NOTE — Progress Notes (Signed)
Hematology/Oncology Follow up note [facility] [phone] [phone]   Patient Care Team: Adin Hector, MD as PCP - General (Internal Medicine)  REFERRING PROVIDER: Adin Hector, MD CHIEF COMPLAINTS/PURPOSE OF CONSULTATION:  Evaluation of iron deficiency anemia  HISTORY OF PRESENTING ILLNESS:  Carla Estrada is a  67 y.o.  female with PMH listed below who was referred to me for evaluation of iron deficiency anemia  Hemoglobin 9.2, MCV 70.5, WBC 5.6, platelet 358,000, basophil 0.11(H), eosinophil 7.7 (H). Ferritin was checked on 08/04/2018 and was low at 5, Patient can not tolerate oral iron supplementation. 12/01/2017 UA was negative for hematuria.  She reports that she has had colonoscopy done with [facility] and was told that she has precancerous process, (?polyps) and she was advised to follow-up in 5 years.  Denies seeing any blood in the stool.  Patient reports feeling extremely fatigued, shortness of breath, lack of energy.  Craves ice chips. 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  She does not know much of her family history because she was raised a child home. Positive for 4-5 pounds of weight loss lately.  No appetite.  Denies any abdominal pain She has a history of B12 deficiency and she get subcutaneous B12 shots prescribed by her primary care physician  INTERVAL HISTORY Carla Estrada is a 67 y.o. female who has above history reviewed by me today presents for follow up visit for management of iron deficiency anemia.  Patient status post Feraheme weekly x2.  Reports energy has significantly improved.  Review of Systems  Constitutional: Positive for weight loss. Negative for chills, fever and malaise/fatigue.  HENT: Negative for congestion, nosebleeds and sore throat.   Eyes: Negative for photophobia and pain.  Respiratory: Negative for cough, sputum production and  shortness of breath.   Cardiovascular: Negative for chest pain, orthopnea and leg swelling.  Gastrointestinal: Positive for heartburn. Negative for abdominal pain, blood in stool, constipation, nausea and vomiting.  Genitourinary: Negative for dysuria, frequency and urgency.  Musculoskeletal: Negative for back pain, myalgias and neck pain.  Skin: Negative for rash.  Neurological: Negative for dizziness, tingling and speech change.  Psychiatric/Behavioral: Negative for depression, substance abuse and suicidal ideas.    MEDICAL HISTORY:  Past Medical History:  Diagnosis Date  . Allergy   . Anemia   . Asthma   . Benign head tremor   . Diabetes mellitus without complication ([place])   . GERD (gastroesophageal reflux disease)   . History of kidney stones   . Hypertension   . Iron deficiency anemia 12/13/2017  . Low vitamin B12 level   . Neuromuscular disorder ([place])   . Restless legs   . Thyroid disease   . Vitamin D deficiency     SURGICAL HISTORY: Past Surgical History:  Procedure Laterality Date  . ABDOMINAL HYSTERECTOMY    . BREAST EXCISIONAL BIOPSY Left   . CHOLECYSTECTOMY    . COLONOSCOPY WITH PROPOFOL N/A 01/05/2018   Procedure: COLONOSCOPY WITH PROPOFOL;  Surgeon: Jonathon Bellows, MD;  Location: [facility] ENDOSCOPY;  Service: Gastroenterology;  Laterality: N/A;  . ESOPHAGOGASTRODUODENOSCOPY (EGD) WITH PROPOFOL N/A 01/05/2018   Procedure: ESOPHAGOGASTRODUODENOSCOPY (EGD) WITH PROPOFOL;  Surgeon: Jonathon Bellows, MD;  Location: [facility] ENDOSCOPY;  Service: Gastroenterology;  Laterality: N/A;  . FOOT SURGERY    . FOOT SURGERY    . GASTRIC BYPASS  2014    SOCIAL HISTORY: Social History  Socioeconomic History  . Marital status: Married    Spouse name: Not on file  . Number of children: Not on file  . Years of education: Not on file  . Highest education level: Not on file  Occupational History  . Not on file  Social Needs  . Financial resource strain: Not on file  . Food insecurity:     Worry: Not on file    Inability: Not on file  . Transportation needs:    Medical: Not on file    Non-medical: Not on file  Tobacco Use  . Smoking status: Former Smoker    Packs/day: 0.25    Years: 20.00    Pack years: 5.00    Types: Cigarettes  . Smokeless tobacco: Never Used  Substance and Sexual Activity  . Alcohol use: No    Frequency: Never  . Drug use: No  . Sexual activity: Yes  Lifestyle  . Physical activity:    Days per week: Not on file    Minutes per session: Not on file  . Stress: Not on file  Relationships  . Social connections:    Talks on phone: Not on file    Gets together: Not on file    Attends religious service: Not on file    Active member of club or organization: Not on file    Attends meetings of clubs or organizations: Not on file    Relationship status: Not on file  . Intimate partner violence:    Fear of current or ex partner: Not on file    Emotionally abused: Not on file    Physically abused: Not on file    Forced sexual activity: Not on file  Other Topics Concern  . Not on file  Social History Narrative  . Not on file    FAMILY HISTORY: Family History  Problem Relation Age of Onset  . Diabetes Mother   . Heart attack Father   . Heart attack Brother   . Breast cancer Paternal Aunt     ALLERGIES:  is allergic to oxycodone-acetaminophen; liraglutide; other; gabapentin; exenatide; and zoledronic acid.  MEDICATIONS:  Current Outpatient Medications  Medication Sig Dispense Refill  . albuterol (PROVENTIL HFA) 108 (90 Base) MCG/ACT inhaler Inhale into the lungs.    . betamethasone dipropionate (DIPROLENE) 0.05 % ointment betamethasone dipropionate 0.05 % topical ointment    . clonazePAM (KLONOPIN) 0.5 MG tablet Take 0.5 mg by mouth 3 (three) times daily as needed.    . Eszopiclone 3 MG TABS Take 3 mg by mouth daily as needed.    Marland Kitchen omeprazole (PRILOSEC) 20 MG capsule Take 20 mg by mouth as needed.    . pramipexole (MIRAPEX) 0.25 MG tablet  Take 0.25 mg by mouth 3 (three) times daily with meals.     . primidone (MYSOLINE) 50 MG tablet Take 50 mg by mouth 2 (two) times daily.    . promethazine (PHENERGAN) 25 MG tablet Take 25 mg by mouth as needed.    . Vitamin D, Ergocalciferol, (DRISDOL) 50000 units CAPS capsule Take 50,000 Units by mouth once a week.    . Cyanocobalamin (B-12 COMPLIANCE INJECTION IJ) Inject 1,000 mcg into the muscle every 30 (thirty) days.     No current facility-administered medications for this visit.      PHYSICAL EXAMINATION: ECOG PERFORMANCE STATUS: 1 - Symptomatic but completely ambulatory Vitals:   01/11/18 1417  BP: (!) 145/85  Pulse: 80  Resp: 18  Temp: 98.3 F (36.8 C)  SpO2: 98%   Filed Weights   01/11/18 1417  Weight: 179 lb (81.2 kg)    Physical Exam  Constitutional: She is oriented to person, place, and time and well-developed, well-nourished, and in no distress. No distress.  HENT:  Head: Normocephalic and atraumatic.  Mouth/Throat: No oropharyngeal exudate.  Eyes: Pupils are equal, round, and reactive to light. EOM are normal. Left eye exhibits no discharge. No scleral icterus.  Neck: Normal range of motion. Neck supple. No thyromegaly present.  Cardiovascular: Normal rate and regular rhythm. Exam reveals no friction rub.  No murmur heard. Pulmonary/Chest: Effort normal and breath sounds normal. She has no wheezes. She has no rales.  Abdominal: Soft. Bowel sounds are normal. She exhibits no distension. There is no rebound.  Musculoskeletal: Normal range of motion. She exhibits no edema.  Lymphadenopathy:    She has no cervical adenopathy.  Neurological: She is alert and oriented to person, place, and time. No cranial nerve deficit.  Skin: Skin is warm and dry. No rash noted. No erythema.  Psychiatric: Mood, memory, affect and judgment normal.     LABORATORY DATA:  I have reviewed the data as listed Lab Results  Component Value Date   WBC 5.5 01/11/2018   HGB 12.8  01/11/2018   HCT 38.2 01/11/2018   MCV 75.5 (L) 01/11/2018   PLT 363 01/11/2018   Recent Labs    12/13/17 1125  NA 137  K 3.7  CL 107  CO2 22  GLUCOSE 129*  BUN 13  CREATININE 0.63  CALCIUM 9.2  GFRNONAA >60  GFRAA >60  PROT 8.1  ALBUMIN 4.2  AST 22  ALT 15  ALKPHOS 103  BILITOT 0.3    Iron/TIBC/Ferritin/ %Sat    Component Value Date/Time   IRON 72 01/11/2018 1351   TIBC 382 01/11/2018 1351   FERRITIN 207 01/11/2018 1351   IRONPCTSAT 19 01/11/2018 1351     ASSESSMENT & PLAN:  1. Iron deficiency anemia, unspecified iron deficiency anemia type   2. B12 deficiency    S/p Feraheme 510 mg weekly x2.  Tolerates well.  Iron panel reviewed with patient which showed adequate iron store.  Hemoglobin improved and completely normalized.  Hold additional IV iron at this point.   # negative celiac panel,negative  intrinsic factor antibody, positive antiparietal antibody. Need to have parental B12 supplements periodically. Recent B12 level is at 350, and she gets B12 injections from primary care physician's office.   EGD show Normal examined jejunum.- Gastric bypass. Gastrojejunal anastomosis characterized by healthy appearing mucosa. - LA Grade A reflux esophagitis. Biopsied. Colonoscopy showed polyps which was resected. Pathology negative for malignancy.    All questions were answered. The patient knows to call the clinic with any problems questions or concerns.  Return of visit: 3 months. with repeat labs. Dr.Klein, thank you for this kind referral and the opportunity to participate in the care of this patient. A copy of today's note is routed to referring provider    Earlie Server, MD, PhD Hematology Oncology [facility] Pager- [phone] 01/11/2018

## 2018-01-12 ENCOUNTER — Encounter: Payer: Self-pay | Admitting: Gastroenterology

## 2018-01-12 ENCOUNTER — Telehealth: Payer: Self-pay | Admitting: Gastroenterology

## 2018-01-12 ENCOUNTER — Other Ambulatory Visit: Payer: Self-pay

## 2018-01-12 DIAGNOSIS — D509 Iron deficiency anemia, unspecified: Secondary | ICD-10-CM

## 2018-01-12 NOTE — Telephone Encounter (Signed)
Pt left vm for someone to call her in regards to her swollowing test she is having Friday please call pt

## 2018-01-13 NOTE — Telephone Encounter (Signed)
Patient cancelled procedure due to husband having major surgery.   Rescheduled office visit also.

## 2018-01-19 ENCOUNTER — Encounter: Admission: RE | Payer: Self-pay | Source: Ambulatory Visit

## 2018-01-19 ENCOUNTER — Ambulatory Visit
Admission: RE | Admit: 2018-01-19 | Payer: BLUE CROSS/BLUE SHIELD | Source: Ambulatory Visit | Admitting: Gastroenterology

## 2018-01-19 SURGERY — IMAGING PROCEDURE, GI TRACT, INTRALUMINAL, VIA CAPSULE

## 2018-01-31 ENCOUNTER — Ambulatory Visit: Payer: BLUE CROSS/BLUE SHIELD | Admitting: Gastroenterology

## 2018-02-01 ENCOUNTER — Ambulatory Visit: Payer: BLUE CROSS/BLUE SHIELD | Admitting: Gastroenterology

## 2018-02-08 ENCOUNTER — Ambulatory Visit: Payer: BLUE CROSS/BLUE SHIELD | Admitting: Gastroenterology

## 2018-04-12 ENCOUNTER — Telehealth: Payer: Self-pay | Admitting: *Deleted

## 2018-04-12 ENCOUNTER — Inpatient Hospital Stay: Payer: BLUE CROSS/BLUE SHIELD

## 2018-04-12 NOTE — Telephone Encounter (Signed)
Per Dr Tasia Catchings, okay to cancel appt on Friday 04/14/18, reschedule out for 2 mths, notified pt, message sent to schedulers to r/s appt.

## 2018-04-12 NOTE — Telephone Encounter (Signed)
Patient called and wants to cancel lab appointment Friday due to the fact that her PCP just drew labs on 5/23 and her insurance will not cover another lab draw so close together. Appointment cancelled and labs from PCP were printed off for MD

## 2018-04-14 ENCOUNTER — Inpatient Hospital Stay: Payer: BLUE CROSS/BLUE SHIELD | Admitting: Oncology

## 2018-06-13 ENCOUNTER — Other Ambulatory Visit: Payer: BLUE CROSS/BLUE SHIELD

## 2018-06-13 ENCOUNTER — Ambulatory Visit: Payer: BLUE CROSS/BLUE SHIELD | Admitting: Oncology

## 2018-06-13 ENCOUNTER — Inpatient Hospital Stay: Payer: BLUE CROSS/BLUE SHIELD | Attending: Oncology

## 2018-06-13 DIAGNOSIS — D509 Iron deficiency anemia, unspecified: Secondary | ICD-10-CM | POA: Insufficient documentation

## 2018-06-13 DIAGNOSIS — Z9884 Bariatric surgery status: Secondary | ICD-10-CM | POA: Diagnosis not present

## 2018-06-13 DIAGNOSIS — E538 Deficiency of other specified B group vitamins: Secondary | ICD-10-CM | POA: Insufficient documentation

## 2018-06-13 LAB — CBC WITH DIFFERENTIAL/PLATELET
BASOS ABS: 0.1 10*3/uL (ref 0–0.1)
BASOS PCT: 1 %
EOS ABS: 0.4 10*3/uL (ref 0–0.7)
Eosinophils Relative: 8 %
HEMATOCRIT: 43.8 % (ref 35.0–47.0)
HEMOGLOBIN: 15.1 g/dL (ref 12.0–16.0)
Lymphocytes Relative: 35 %
Lymphs Abs: 2 10*3/uL (ref 1.0–3.6)
MCH: 29.4 pg (ref 26.0–34.0)
MCHC: 34.5 g/dL (ref 32.0–36.0)
MCV: 85.2 fL (ref 80.0–100.0)
Monocytes Absolute: 0.3 10*3/uL (ref 0.2–0.9)
Monocytes Relative: 6 %
NEUTROS ABS: 2.8 10*3/uL (ref 1.4–6.5)
Neutrophils Relative %: 50 %
Platelets: 331 10*3/uL (ref 150–440)
RBC: 5.14 MIL/uL (ref 3.80–5.20)
RDW: 12.6 % (ref 11.5–14.5)
WBC: 5.6 10*3/uL (ref 3.6–11.0)

## 2018-06-13 LAB — FERRITIN: FERRITIN: 56 ng/mL (ref 11–307)

## 2018-06-13 LAB — IRON AND TIBC
Iron: 123 ug/dL (ref 28–170)
SATURATION RATIOS: 29 % (ref 10.4–31.8)
TIBC: 426 ug/dL (ref 250–450)
UIBC: 303 ug/dL

## 2018-06-14 ENCOUNTER — Other Ambulatory Visit: Payer: Self-pay

## 2018-06-14 ENCOUNTER — Encounter: Payer: Self-pay | Admitting: Oncology

## 2018-06-14 ENCOUNTER — Inpatient Hospital Stay (HOSPITAL_BASED_OUTPATIENT_CLINIC_OR_DEPARTMENT_OTHER): Payer: BLUE CROSS/BLUE SHIELD | Admitting: Oncology

## 2018-06-14 VITALS — BP 157/98 | HR 89 | Temp 96.7°F | Resp 18 | Wt 176.3 lb

## 2018-06-14 DIAGNOSIS — Z9884 Bariatric surgery status: Secondary | ICD-10-CM | POA: Diagnosis not present

## 2018-06-14 DIAGNOSIS — E538 Deficiency of other specified B group vitamins: Secondary | ICD-10-CM

## 2018-06-14 DIAGNOSIS — D509 Iron deficiency anemia, unspecified: Secondary | ICD-10-CM

## 2018-06-14 DIAGNOSIS — R12 Heartburn: Secondary | ICD-10-CM

## 2018-06-14 NOTE — Progress Notes (Signed)
Patient here for follow up

## 2018-06-15 NOTE — Progress Notes (Signed)
Hematology/Oncology Follow up note [facility] [phone] Fax:(336) [phone]   Patient Care Team: Adin Hector, MD as PCP - General (Internal Medicine)  REFERRING PROVIDER: Adin Hector, MD CHIEF COMPLAINTS/PURPOSE OF CONSULTATION:  Evaluation of iron deficiency anemia  HISTORY OF PRESENTING ILLNESS:  Carla Estrada is a  67 y.o.  female with PMH listed below who was referred to me for evaluation of iron deficiency anemia  Hemoglobin 9.2, MCV 70.5, WBC 5.6, platelet 358,000, basophil 0.11(H), eosinophil 7.7 (H). Ferritin was checked on 08/04/2018 and was low at 5, Patient can not tolerate oral iron supplementation. 12/01/2017 UA was negative for hematuria.  She reports that she has had colonoscopy done with [facility] and was told that she has precancerous process, (?polyps) and she was advised to follow-up in 5 years.  Denies seeing any blood in the stool.  Patient reports feeling extremely fatigued, shortness of breath, lack of energy.  Craves ice chips. 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  She does not know much of her family history because she was raised a child home. Positive for 4-5 pounds of weight loss lately.  No appetite.  Denies any abdominal pain She has a history of B12 deficiency and she get subcutaneous B12 shots prescribed by her primary care physician  INTERVAL HISTORY Carla Estrada is a 67 y.o. female who has above presents for follow-up for iron deficiency anemia management.  #Fatigue, chronic.  Feels at baseline. #History of B12 deficiency.  She is been getting subcutaneous B12 shots prescribed by her primary care physician. No new complaints.  Review of Systems  Constitutional: Positive for weight loss. Negative for chills, fever and malaise/fatigue.  HENT: Negative for congestion, nosebleeds and sore throat.   Eyes: Negative for double vision, photophobia,  pain and redness.  Respiratory: Negative for cough, sputum production, shortness of breath and wheezing.   Cardiovascular: Negative for chest pain, palpitations, orthopnea and leg swelling.  Gastrointestinal: Positive for heartburn. Negative for abdominal pain, blood in stool, constipation, nausea and vomiting.  Genitourinary: Negative for dysuria, frequency and urgency.  Musculoskeletal: Negative for back pain, myalgias and neck pain.  Skin: Negative for itching and rash.  Neurological: Negative for dizziness, tingling, tremors and speech change.  Endo/Heme/Allergies: Negative for environmental allergies. Does not bruise/bleed easily.  Psychiatric/Behavioral: Negative for depression, substance abuse and suicidal ideas.    MEDICAL HISTORY:  Past Medical History:  Diagnosis Date  . Allergy   . Anemia   . Asthma   . Benign head tremor   . Diabetes mellitus without complication ([place])   . GERD (gastroesophageal reflux disease)   . History of kidney stones   . Hypertension   . Iron deficiency anemia 12/13/2017  . Low vitamin B12 level   . Neuromuscular disorder ([place])   . Restless legs   . Thyroid disease   . Vitamin D deficiency     SURGICAL HISTORY: Past Surgical History:  Procedure Laterality Date  . ABDOMINAL HYSTERECTOMY    . BREAST EXCISIONAL BIOPSY Left   . CHOLECYSTECTOMY    . COLONOSCOPY WITH PROPOFOL N/A 01/05/2018   Procedure: COLONOSCOPY WITH PROPOFOL;  Surgeon: Jonathon Bellows, MD;  Location: [facility] ENDOSCOPY;  Service: Gastroenterology;  Laterality: N/A;  . ESOPHAGOGASTRODUODENOSCOPY (EGD) WITH PROPOFOL N/A 01/05/2018   Procedure: ESOPHAGOGASTRODUODENOSCOPY (EGD) WITH PROPOFOL;  Surgeon: Jonathon Bellows, MD;  Location[facility] ENDOSCOPY;  Service: Gastroenterology;  Laterality: N/A;  .  FOOT SURGERY    . FOOT SURGERY    . GASTRIC BYPASS  2014    SOCIAL HISTORY: Social History   Socioeconomic History  . Marital status: Married    Spouse name: Not on file  . Number of  children: Not on file  . Years of education: Not on file  . Highest education level: Not on file  Occupational History  . Not on file  Social Needs  . Financial resource strain: Not on file  . Food insecurity:    Worry: Not on file    Inability: Not on file  . Transportation needs:    Medical: Not on file    Non-medical: Not on file  Tobacco Use  . Smoking status: Former Smoker    Packs/day: 0.25    Years: 20.00    Pack years: 5.00    Types: Cigarettes  . Smokeless tobacco: Never Used  Substance and Sexual Activity  . Alcohol use: No    Frequency: Never  . Drug use: No  . Sexual activity: Yes  Lifestyle  . Physical activity:    Days per week: Not on file    Minutes per session: Not on file  . Stress: Not on file  Relationships  . Social connections:    Talks on phone: Not on file    Gets together: Not on file    Attends religious service: Not on file    Active member of club or organization: Not on file    Attends meetings of clubs or organizations: Not on file    Relationship status: Not on file  . Intimate partner violence:    Fear of current or ex partner: Not on file    Emotionally abused: Not on file    Physically abused: Not on file    Forced sexual activity: Not on file  Other Topics Concern  . Not on file  Social History Narrative  . Not on file    FAMILY HISTORY: Family History  Problem Relation Age of Onset  . Diabetes Mother   . Heart attack Father   . Heart attack Brother   . Breast cancer Paternal Aunt     ALLERGIES:  is allergic to oxycodone-acetaminophen; liraglutide; other; gabapentin; exenatide; and zoledronic acid.  MEDICATIONS:  Current Outpatient Medications  Medication Sig Dispense Refill  . albuterol (PROVENTIL HFA) 108 (90 Base) MCG/ACT inhaler Inhale into the lungs.    . betamethasone dipropionate (DIPROLENE) 0.05 % ointment betamethasone dipropionate 0.05 % topical ointment    . calcitRIOL (ROCALTROL) 1 MCG/ML solution Take by  mouth. Take 0.3 mLs (0.3 mcg total) by mouth once daily    . clonazePAM (KLONOPIN) 0.5 MG tablet Take 0.5 mg by mouth 3 (three) times daily as needed.    . Cyanocobalamin (B-12 COMPLIANCE INJECTION IJ) Inject 1,000 mcg into the muscle every 30 (thirty) days.    . Eszopiclone 3 MG TABS Take 3 mg by mouth daily as needed.    Marland Kitchen omeprazole (PRILOSEC) 20 MG capsule Take 20 mg by mouth as needed.    . pramipexole (MIRAPEX) 0.25 MG tablet Take 0.25 mg by mouth 3 (three) times daily with meals.     . primidone (MYSOLINE) 50 MG tablet Take 50 mg by mouth 2 (two) times daily.    . promethazine (PHENERGAN) 25 MG tablet Take 25 mg by mouth as needed.     No current facility-administered medications for this visit.      PHYSICAL EXAMINATION: ECOG PERFORMANCE STATUS: 1 -  Symptomatic but completely ambulatory Vitals:   06/14/18 1022  BP: (!) 157/98  Pulse: 89  Resp: 18  Temp: (!) 96.7 F (35.9 C)  SpO2: 98%   Filed Weights   06/14/18 1022  Weight: 176 lb 4.8 oz (80 kg)    Physical Exam  Constitutional: She is oriented to person, place, and time and well-developed, well-nourished, and in no distress. No distress.  HENT:  Head: Normocephalic and atraumatic.  Nose: Nose normal.  Mouth/Throat: Oropharynx is clear and moist. No oropharyngeal exudate.  Eyes: Pupils are equal, round, and reactive to light. EOM are normal. Left eye exhibits no discharge. No scleral icterus.  Neck: Normal range of motion. Neck supple. No thyromegaly present.  Cardiovascular: Normal rate and regular rhythm. Exam reveals no friction rub.  No murmur heard. Pulmonary/Chest: Effort normal and breath sounds normal. No respiratory distress. She has no wheezes. She has no rales. She exhibits no tenderness.  Abdominal: Soft. Bowel sounds are normal. She exhibits no distension and no mass. There is no tenderness. There is no rebound.  Musculoskeletal: Normal range of motion. She exhibits no edema.  Lymphadenopathy:    She  has no cervical adenopathy.  Neurological: She is alert and oriented to person, place, and time. No cranial nerve deficit. She exhibits normal muscle tone. Coordination normal.  Skin: Skin is warm and dry. No rash noted. She is not diaphoretic. No erythema.  Psychiatric: Mood, memory, affect and judgment normal.     LABORATORY DATA:  I have reviewed the data as listed Lab Results  Component Value Date   WBC 5.6 06/13/2018   HGB 15.1 06/13/2018   HCT 43.8 06/13/2018   MCV 85.2 06/13/2018   PLT 331 06/13/2018   Recent Labs    12/13/17 1125  NA 137  K 3.7  CL 107  CO2 22  GLUCOSE 129*  BUN 13  CREATININE 0.63  CALCIUM 9.2  GFRNONAA >60  GFRAA >60  PROT 8.1  ALBUMIN 4.2  AST 22  ALT 15  ALKPHOS 103  BILITOT 0.3    Iron/TIBC/Ferritin/ %Sat    Component Value Date/Time   IRON 123 06/13/2018 1056   TIBC 426 06/13/2018 1056   FERRITIN 56 06/13/2018 1056   IRONPCTSAT 29 06/13/2018 1056     ASSESSMENT & PLAN:  1. Iron deficiency anemia, unspecified iron deficiency anemia type   2. B12 deficiency   3. Heart burn    #Labs reviewed and discussed with patient.  Previously received Feraheme.  Hemoglobin has improved to 15.1.  Iron panel showed a ferritin 56, which was a decrease from 207 four months ago. TIBC increased to 426, increased from 382 four months ago.  Discussed with patient that although her iron panel showed decreased iron store.  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 Her hemoglobin has been normalized completely.  Hemoglobin at high normal end. Check JAK 2 mutation at next visit.  Continue monitor.  Recommend holding additional IV iron for now.  #Vitamin B12 deficiency.  Previously worked up showed negative celiac panel, negative intrinsic factor antibody.  Positive antiparietal antibody.  She is getting chronic parenteral B12 injection through primary care physician's office.  #Heartburn, continue Prilosec 20 mg daily.  She has had history of gastric  bypass.  Symptoms are manageable. EGD show Normal examined jejunum.- Gastric bypass. Gastrojejunal anastomosis characterized by healthy appearing mucosa. - LA Grade A reflux esophagitis. Biopsied. Colonoscopy showed polyps which was resected. Pathology negative for malignancy.   All questions were answered. The patient knows  to call the clinic with any problems questions or concerns. Orders Placed This Encounter  Procedures  . CBC with Differential/Platelet  . JAK2 V617F, w Reflex to CALR/E12/MPL  . Iron and TIBC  . Ferritin  . Vitamin B12   Return of visit: 3 months  with repeat labs. Total face to face encounter time for this patient visit was 25 min. >50% of the time was  spent in counseling and coordination of care.    Earlie Server, MD, PhD Hematology Oncology [facility] Pager- [phone] 06/15/2018

## 2018-08-11 ENCOUNTER — Inpatient Hospital Stay: Payer: Medicare Other | Attending: Oncology

## 2018-08-11 ENCOUNTER — Other Ambulatory Visit: Payer: Self-pay

## 2018-08-11 DIAGNOSIS — R634 Abnormal weight loss: Secondary | ICD-10-CM | POA: Insufficient documentation

## 2018-08-11 DIAGNOSIS — G4730 Sleep apnea, unspecified: Secondary | ICD-10-CM | POA: Insufficient documentation

## 2018-08-11 DIAGNOSIS — R5383 Other fatigue: Secondary | ICD-10-CM | POA: Insufficient documentation

## 2018-08-11 DIAGNOSIS — E119 Type 2 diabetes mellitus without complications: Secondary | ICD-10-CM | POA: Diagnosis not present

## 2018-08-11 DIAGNOSIS — E079 Disorder of thyroid, unspecified: Secondary | ICD-10-CM | POA: Diagnosis not present

## 2018-08-11 DIAGNOSIS — E538 Deficiency of other specified B group vitamins: Secondary | ICD-10-CM | POA: Diagnosis not present

## 2018-08-11 DIAGNOSIS — D509 Iron deficiency anemia, unspecified: Secondary | ICD-10-CM | POA: Insufficient documentation

## 2018-08-11 DIAGNOSIS — R0602 Shortness of breath: Secondary | ICD-10-CM | POA: Diagnosis not present

## 2018-08-11 DIAGNOSIS — K210 Gastro-esophageal reflux disease with esophagitis: Secondary | ICD-10-CM | POA: Insufficient documentation

## 2018-08-11 DIAGNOSIS — E559 Vitamin D deficiency, unspecified: Secondary | ICD-10-CM | POA: Diagnosis not present

## 2018-08-11 LAB — CBC WITH DIFFERENTIAL/PLATELET
ABS IMMATURE GRANULOCYTES: 0.01 10*3/uL (ref 0.00–0.07)
BASOS ABS: 0.1 10*3/uL (ref 0.0–0.1)
Basophils Relative: 1 %
EOS ABS: 0.2 10*3/uL (ref 0.0–0.5)
Eosinophils Relative: 4 %
HEMATOCRIT: 41.9 % (ref 36.0–46.0)
HEMOGLOBIN: 13.8 g/dL (ref 12.0–15.0)
IMMATURE GRANULOCYTES: 0 %
LYMPHS ABS: 2 10*3/uL (ref 0.7–4.0)
LYMPHS PCT: 43 %
MCH: 28 pg (ref 26.0–34.0)
MCHC: 32.9 g/dL (ref 30.0–36.0)
MCV: 85 fL (ref 80.0–100.0)
Monocytes Absolute: 0.3 10*3/uL (ref 0.1–1.0)
Monocytes Relative: 6 %
NEUTROS ABS: 2.2 10*3/uL (ref 1.7–7.7)
NEUTROS PCT: 46 %
Platelets: 260 10*3/uL (ref 150–400)
RBC: 4.93 MIL/uL (ref 3.87–5.11)
RDW: 11.9 % (ref 11.5–15.5)
WBC: 4.7 10*3/uL (ref 4.0–10.5)
nRBC: 0 % (ref 0.0–0.2)

## 2018-08-14 ENCOUNTER — Other Ambulatory Visit: Payer: BLUE CROSS/BLUE SHIELD

## 2018-08-14 ENCOUNTER — Inpatient Hospital Stay (HOSPITAL_BASED_OUTPATIENT_CLINIC_OR_DEPARTMENT_OTHER): Payer: Medicare Other | Admitting: Oncology

## 2018-08-14 ENCOUNTER — Other Ambulatory Visit: Payer: Self-pay

## 2018-08-14 ENCOUNTER — Encounter: Payer: Self-pay | Admitting: Oncology

## 2018-08-14 VITALS — BP 136/94 | HR 92 | Temp 96.6°F | Resp 18 | Wt 174.4 lb

## 2018-08-14 DIAGNOSIS — E538 Deficiency of other specified B group vitamins: Secondary | ICD-10-CM | POA: Diagnosis not present

## 2018-08-14 DIAGNOSIS — R5383 Other fatigue: Secondary | ICD-10-CM | POA: Diagnosis not present

## 2018-08-14 DIAGNOSIS — D509 Iron deficiency anemia, unspecified: Secondary | ICD-10-CM | POA: Diagnosis not present

## 2018-08-14 DIAGNOSIS — G4730 Sleep apnea, unspecified: Secondary | ICD-10-CM

## 2018-08-14 DIAGNOSIS — K210 Gastro-esophageal reflux disease with esophagitis: Secondary | ICD-10-CM

## 2018-08-14 NOTE — Progress Notes (Signed)
Patient here for follow up. Pt states she feels tired all the time.

## 2018-08-14 NOTE — Progress Notes (Signed)
Hematology/Oncology Follow up note [facility] Telephone:(336) [phone] Fax:(336) [phone]   Patient Care Team: Adin Hector, MD as PCP - General (Internal Medicine)  REFERRING PROVIDER: Adin Hector, MD CHIEF COMPLAINTS/PURPOSE OF CONSULTATION:  Evaluation of iron deficiency anemia  HISTORY OF PRESENTING ILLNESS:  Carla [facility] is a  67 y.o.  female with PMH listed below who was referred to me for evaluation of iron deficiency anemia  Hemoglobin 9.2, MCV 70.5, WBC 5.6, platelet 358,000, basophil 0.11(H), eosinophil 7.7 (H). Ferritin was checked on 08/04/2018 and was low at 5, Patient can not tolerate oral iron supplementation. 12/01/2017 UA was negative for hematuria.  She reports that she has had colonoscopy done with [facility] and was told that she has precancerous process, (?polyps) and she was advised to follow-up in 5 years.  Denies seeing any blood in the stool.  Patient reports feeling extremely fatigued, shortness of breath, lack of energy.  Craves ice chips. 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  She does not know much of her family history because she was raised a child home.  She has a history of B12 deficiency and she get subcutaneous B12 shots prescribed by her primary care physician  01/05/2018 EGD show Normal examined jejunum.- Gastric bypass. Gastrojejunal anastomosis characterized by healthy appearing mucosa. - LA Grade A reflux esophagitis. Biopsied. Colonoscopy showed polyps which was resected. Pathology negative for malignancy.   INTERVAL HISTORY Carla Estrada is a 67 y.o. female who has above presents for follow-up for iron deficiency anemia management.  #Patient feels energy has gotten better after IV iron infusion but lately started to have more fatigue. Does not sleep very well at night.  She has problems remaining asleep at night.  Wakes up early She is sometimes once  to have a nap around 5:00 PM #Weight has been stable  Reports that in the past she has had sleep apnea work-up and although not clear whether she got a diagnosis of sleep apnea, she was started on CPAP machine for couple of months and she could not tolerate.  So currently not on any CPAP machine.   Review of Systems  Constitutional: Positive for weight loss. Negative for chills, fever and malaise/fatigue.  HENT: Negative for congestion, nosebleeds and sore throat.   Eyes: Negative for double vision, photophobia, pain and redness.  Respiratory: Negative for cough, sputum production, shortness of breath and wheezing.   Cardiovascular: Negative for chest pain, palpitations, orthopnea and leg swelling.  Gastrointestinal: Positive for heartburn. Negative for abdominal pain, blood in stool, constipation, nausea and vomiting.  Genitourinary: Negative for dysuria, frequency and urgency.  Musculoskeletal: Negative for back pain, myalgias and neck pain.  Skin: Negative for itching and rash.  Neurological: Negative for dizziness, tingling, tremors and speech change.  Endo/Heme/Allergies: Negative for environmental allergies. Does not bruise/bleed easily.  Psychiatric/Behavioral: Negative for depression, substance abuse and suicidal ideas.    MEDICAL HISTORY:  Past Medical History:  Diagnosis Date  . Allergy   . Anemia   . Asthma   . Benign head tremor   . Diabetes mellitus without complication ([place])   . GERD (gastroesophageal reflux disease)   . History of kidney stones   . Hypertension   . Iron deficiency anemia 12/13/2017  . Low vitamin B12 level   . Neuromuscular disorder ([place])   . Restless legs   . Thyroid disease   . Vitamin D  deficiency     SURGICAL HISTORY: Past Surgical History:  Procedure Laterality Date  . ABDOMINAL HYSTERECTOMY    . BREAST EXCISIONAL BIOPSY Left   . CHOLECYSTECTOMY    . COLONOSCOPY WITH PROPOFOL N/A 01/05/2018   Procedure: COLONOSCOPY WITH PROPOFOL;   Surgeon: Jonathon Bellows, MD;  Location: [facility] ENDOSCOPY;  Service: Gastroenterology;  Laterality: N/A;  . ESOPHAGOGASTRODUODENOSCOPY (EGD) WITH PROPOFOL N/A 01/05/2018   Procedure: ESOPHAGOGASTRODUODENOSCOPY (EGD) WITH PROPOFOL;  Surgeon: Jonathon Bellows, MD;  Location: [facility] ENDOSCOPY;  Service: Gastroenterology;  Laterality: N/A;  . FOOT SURGERY    . FOOT SURGERY    . GASTRIC BYPASS  2014    SOCIAL HISTORY: Social History   Socioeconomic History  . Marital status: Married    Spouse name: Not on file  . Number of children: Not on file  . Years of education: Not on file  . Highest education level: Not on file  Occupational History  . Not on file  Social Needs  . Financial resource strain: Not on file  . Food insecurity:    Worry: Not on file    Inability: Not on file  . Transportation needs:    Medical: Not on file    Non-medical: Not on file  Tobacco Use  . Smoking status: Former Smoker    Packs/day: 0.25    Years: 20.00    Pack years: 5.00    Types: Cigarettes  . Smokeless tobacco: Never Used  Substance and Sexual Activity  . Alcohol use: No    Frequency: Never  . Drug use: No  . Sexual activity: Yes  Lifestyle  . Physical activity:    Days per week: Not on file    Minutes per session: Not on file  . Stress: Not on file  Relationships  . Social connections:    Talks on phone: Not on file    Gets together: Not on file    Attends religious service: Not on file    Active member of club or organization: Not on file    Attends meetings of clubs or organizations: Not on file    Relationship status: Not on file  . Intimate partner violence:    Fear of current or ex partner: Not on file    Emotionally abused: Not on file    Physically abused: Not on file    Forced sexual activity: Not on file  Other Topics Concern  . Not on file  Social History Narrative  . Not on file    FAMILY HISTORY: Family History  Problem Relation Age of Onset  . Diabetes Mother   . Heart  attack Father   . Heart attack Brother   . Breast cancer Paternal Aunt     ALLERGIES:  is allergic to oxycodone-acetaminophen; liraglutide; other; gabapentin; exenatide; and zoledronic acid.  MEDICATIONS:  Current Outpatient Medications  Medication Sig Dispense Refill  . albuterol (PROVENTIL HFA) 108 (90 Base) MCG/ACT inhaler Inhale into the lungs.    . betamethasone dipropionate (DIPROLENE) 0.05 % ointment betamethasone dipropionate 0.05 % topical ointment    . calcitRIOL (ROCALTROL) 1 MCG/ML solution Take by mouth. Take 0.3 mLs (0.3 mcg total) by mouth once daily    . clonazePAM (KLONOPIN) 0.5 MG tablet Take 0.5 mg by mouth 3 (three) times daily as needed.    . Cyanocobalamin (B-12 COMPLIANCE INJECTION IJ) Inject 1,000 mcg into the muscle every 30 (thirty) days.    . fluocinolone (SYNALAR) 0.025 % ointment APPLY A THIN LAYER OF OINTMENT TOPICALLY AA  PRN  2  . furosemide (LASIX) 40 MG tablet Take 40 mg by mouth daily as needed.    Marland Kitchen glipiZIDE (GLUCOTROL XL) 5 MG 24 hr tablet Take 5 mg by mouth daily.    Marland Kitchen omeprazole (PRILOSEC) 20 MG capsule Take 20 mg by mouth as needed.    . pramipexole (MIRAPEX) 0.25 MG tablet Take 0.25 mg by mouth 3 (three) times daily with meals.     . primidone (MYSOLINE) 50 MG tablet Take 50 mg by mouth 2 (two) times daily.    . promethazine (PHENERGAN) 25 MG tablet Take 25 mg by mouth as needed.    . Eszopiclone 3 MG TABS Take 3 mg by mouth daily as needed.     No current facility-administered medications for this visit.      PHYSICAL EXAMINATION: ECOG PERFORMANCE STATUS: 1 - Symptomatic but completely ambulatory Vitals:   08/14/18 1026  BP: (!) 136/94  Pulse: 92  Resp: 18  Temp: (!) 96.6 F (35.9 C)   Filed Weights   08/14/18 1026  Weight: 174 lb 6.4 oz (79.1 kg)    Physical Exam  Constitutional: She is oriented to person, place, and time. No distress.  HENT:  Head: Normocephalic and atraumatic.  Nose: Nose normal.  Mouth/Throat: Oropharynx is  clear and moist. No oropharyngeal exudate.  Eyes: Pupils are equal, round, and reactive to light. EOM are normal. Left eye exhibits no discharge. No scleral icterus.  Neck: Normal range of motion. Neck supple. No thyromegaly present.  Cardiovascular: Normal rate and regular rhythm. Exam reveals no friction rub.  No murmur heard. Pulmonary/Chest: Effort normal and breath sounds normal. No respiratory distress. She has no wheezes. She has no rales. She exhibits no tenderness.  Abdominal: Soft. Bowel sounds are normal. She exhibits no distension and no mass. There is no tenderness. There is no rebound.  Musculoskeletal: Normal range of motion. She exhibits no edema.  Lymphadenopathy:    She has no cervical adenopathy.  Neurological: She is alert and oriented to person, place, and time. No cranial nerve deficit. She exhibits normal muscle tone. Coordination normal.  Skin: Skin is warm and dry. No rash noted. She is not diaphoretic. No erythema.  Psychiatric: Mood, memory, affect and judgment normal.     LABORATORY DATA:  I have reviewed the data as listed Lab Results  Component Value Date   WBC 4.7 08/11/2018   HGB 13.8 08/11/2018   HCT 41.9 08/11/2018   MCV 85.0 08/11/2018   PLT 260 08/11/2018   Recent Labs    12/13/17 1125  NA 137  K 3.7  CL 107  CO2 22  GLUCOSE 129*  BUN 13  CREATININE 0.63  CALCIUM 9.2  GFRNONAA >60  GFRAA >60  PROT 8.1  ALBUMIN 4.2  AST 22  ALT 15  ALKPHOS 103  BILITOT 0.3    Iron/TIBC/Ferritin/ %Sat    Component Value Date/Time   IRON 123 06/13/2018 1056   TIBC 426 06/13/2018 1056   FERRITIN 56 06/13/2018 1056   IRONPCTSAT 29 06/13/2018 1056     ASSESSMENT & PLAN:  1. Iron deficiency anemia, unspecified iron deficiency anemia type   2. Fatigue, unspecified type    #Labs reviewed, hemoglobin decreased from 15.1-13.8.  Iron panels were not checked this time. Discussed with patient that it is reassuring that hemoglobin did not further go  up. Jak 2 mutation with reflex pending. Hold additional IV iron for now. We also discussed that her underlying sleep apnea may also  contribute to her high normal hemoglobin and rapid utilize of iron. Continue monitor counts.  Repeat CBC in 3 months.  Vitamin B12 deficiency.  Negative for celiac panel, intrinsic factor antibody, positive antiparietal antibody.  She should continue parenteral B12 injections through primary care physician's office.  Regarding to her fatigue, discussed with patient that her fatigue is not proportional to her hemoglobin level. I suspect this is related to her underlying CPAP.  Recommend follow-up with primary care physician for further evaluation.   All questions were answered. The patient knows to call the clinic with any problems questions or concerns. Orders Placed This Encounter  Procedures  . CBC with Differential/Platelet  . Iron and TIBC  . Ferritin   Return of visit: 3 months  with repeat labs.  Earlie Server, MD, PhD Hematology Oncology [facility] Pager- [phone] 08/14/2018

## 2018-08-24 LAB — CALR + JAK2 E12-15 + MPL (REFLEXED)

## 2018-08-24 LAB — JAK2 V617F, W REFLEX TO CALR/E12/MPL

## 2018-10-11 HISTORY — PX: OTHER SURGICAL HISTORY: SHX169

## 2018-11-13 ENCOUNTER — Inpatient Hospital Stay: Payer: Medicare Other | Attending: Oncology

## 2018-11-13 ENCOUNTER — Other Ambulatory Visit: Payer: Self-pay | Admitting: Oncology

## 2018-11-13 DIAGNOSIS — D509 Iron deficiency anemia, unspecified: Secondary | ICD-10-CM | POA: Diagnosis present

## 2018-11-13 DIAGNOSIS — E538 Deficiency of other specified B group vitamins: Secondary | ICD-10-CM | POA: Diagnosis not present

## 2018-11-13 LAB — IRON AND TIBC
Iron: 109 ug/dL (ref 28–170)
SATURATION RATIOS: 25 % (ref 10.4–31.8)
TIBC: 438 ug/dL (ref 250–450)
UIBC: 330 ug/dL

## 2018-11-13 LAB — CBC WITH DIFFERENTIAL/PLATELET
Abs Immature Granulocytes: 0.01 10*3/uL (ref 0.00–0.07)
Basophils Absolute: 0.1 10*3/uL (ref 0.0–0.1)
Basophils Relative: 1 %
EOS ABS: 0.2 10*3/uL (ref 0.0–0.5)
EOS PCT: 4 %
HCT: 41.7 % (ref 36.0–46.0)
HEMOGLOBIN: 14 g/dL (ref 12.0–15.0)
IMMATURE GRANULOCYTES: 0 %
LYMPHS ABS: 2 10*3/uL (ref 0.7–4.0)
Lymphocytes Relative: 49 %
MCH: 28.6 pg (ref 26.0–34.0)
MCHC: 33.6 g/dL (ref 30.0–36.0)
MCV: 85.1 fL (ref 80.0–100.0)
MONOS PCT: 5 %
Monocytes Absolute: 0.2 10*3/uL (ref 0.1–1.0)
Neutro Abs: 1.7 10*3/uL (ref 1.7–7.7)
Neutrophils Relative %: 41 %
Platelets: 269 10*3/uL (ref 150–400)
RBC: 4.9 MIL/uL (ref 3.87–5.11)
RDW: 12.6 % (ref 11.5–15.5)
WBC: 4.1 10*3/uL (ref 4.0–10.5)
nRBC: 0 % (ref 0.0–0.2)

## 2018-11-13 LAB — FERRITIN: Ferritin: 40 ng/mL (ref 11–307)

## 2018-11-15 ENCOUNTER — Other Ambulatory Visit: Payer: Self-pay

## 2018-11-15 ENCOUNTER — Inpatient Hospital Stay (HOSPITAL_BASED_OUTPATIENT_CLINIC_OR_DEPARTMENT_OTHER): Payer: Medicare Other | Admitting: Oncology

## 2018-11-15 ENCOUNTER — Inpatient Hospital Stay: Payer: Medicare Other

## 2018-11-15 ENCOUNTER — Encounter: Payer: Self-pay | Admitting: Oncology

## 2018-11-15 VITALS — BP 149/89 | HR 93 | Temp 96.2°F | Resp 18 | Wt 182.9 lb

## 2018-11-15 DIAGNOSIS — E538 Deficiency of other specified B group vitamins: Secondary | ICD-10-CM | POA: Diagnosis not present

## 2018-11-15 DIAGNOSIS — D509 Iron deficiency anemia, unspecified: Secondary | ICD-10-CM | POA: Diagnosis not present

## 2018-11-15 NOTE — Progress Notes (Signed)
Patient her for follow up. States feeling a little tired. Had cataract surgery about 2 weeks ago and is recovering well.

## 2018-11-16 NOTE — Progress Notes (Signed)
Hematology/Oncology Follow up note [facility] Telephone:([phone] Fax:(336) [phone]   Patient Care Team: Adin Hector, MD as PCP - General (Internal Medicine)  REFERRING PROVIDER: Adin Hector, MD CHIEF COMPLAINTS/PURPOSE OF CONSULTATION:  Evaluation of iron deficiency anemia  HISTORY OF PRESENTING ILLNESS:  Carla Estrada is a  68 y.o.  female with PMH listed below who was referred to me for evaluation of iron deficiency anemia  Hemoglobin 9.2, MCV 70.5, WBC 5.6, platelet 358,000, basophil 0.11(H), eosinophil 7.7 (H). Ferritin was checked on 08/04/2018 and was low at 5, Patient can not tolerate oral iron supplementation. 12/01/2017 UA was negative for hematuria.  She reports that she has had colonoscopy done with [facility] and was told that she has precancerous process, (?polyps) and she was advised to follow-up in 5 years.  Denies seeing any blood in the stool.  Patient reports feeling extremely fatigued, shortness of breath, lack of energy.  Craves ice chips. 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  She does not know much of her family history because she was raised a child home.  She has a history of B12 deficiency and she get subcutaneous B12 shots prescribed by her primary care physician  01/05/2018 EGD show Normal examined jejunum.- Gastric bypass. Gastrojejunal anastomosis characterized by healthy appearing mucosa. - LA Grade A reflux esophagitis. Biopsied. Colonoscopy showed polyps which was resected. Pathology negative for malignancy.   INTERVAL HISTORY Carla Estrada is a 68 y.o. female who has above presents for follow-up for iron deficiency anemia management. Patient reports feeling pretty well except feeling little tired.  No other new complaints. She had cataract surgery about 2 weeks ago.  Recovering well. Gained 8 pounds since November 2019. Review of Systems    Constitutional: Negative for chills, fever, malaise/fatigue and weight loss.  HENT: Negative for sore throat.   Eyes: Negative for redness.  Respiratory: Negative for cough, shortness of breath and wheezing.   Cardiovascular: Negative for chest pain, palpitations and leg swelling.  Gastrointestinal: Negative for abdominal pain, blood in stool, nausea and vomiting.  Genitourinary: Negative for dysuria.  Musculoskeletal: Negative for myalgias.  Skin: Negative for rash.  Neurological: Negative for dizziness, tingling and tremors.  Endo/Heme/Allergies: Does not bruise/bleed easily.  Psychiatric/Behavioral: Negative for hallucinations.    MEDICAL HISTORY:  Past Medical History:  Diagnosis Date  . Allergy   . Anemia   . Asthma   . Benign head tremor   . Diabetes mellitus without complication ([place])   . GERD (gastroesophageal reflux disease)   . History of kidney stones   . Hypertension   . Iron deficiency anemia 12/13/2017  . Low vitamin B12 level   . Neuromuscular disorder ([place])   . Restless legs   . Thyroid disease   . Vitamin D deficiency     SURGICAL HISTORY: Past Surgical History:  Procedure Laterality Date  . ABDOMINAL HYSTERECTOMY    . BREAST EXCISIONAL BIOPSY Left   . CHOLECYSTECTOMY    . COLONOSCOPY WITH PROPOFOL N/A 01/05/2018   Procedure: COLONOSCOPY WITH PROPOFOL;  Surgeon: Jonathon Bellows, MD;  Location: [facility] ENDOSCOPY;  Service: Gastroenterology;  Laterality: N/A;  . ESOPHAGOGASTRODUODENOSCOPY (EGD) WITH PROPOFOL N/A 01/05/2018   Procedure: ESOPHAGOGASTRODUODENOSCOPY (EGD) WITH PROPOFOL;  Surgeon: Jonathon Bellows, MD;  Location: [facility] ENDOSCOPY;  Service: Gastroenterology;  Laterality: N/A;  . FOOT SURGERY    . FOOT SURGERY    . GASTRIC BYPASS  2014    SOCIAL HISTORY: Social History   Socioeconomic History  . Marital status: Married    Spouse name: Not on file  . Number of children: Not on file  . Years of education: Not on file  . Highest education level: Not on  file  Occupational History  . Not on file  Social Needs  . Financial resource strain: Not on file  . Food insecurity:    Worry: Not on file    Inability: Not on file  . Transportation needs:    Medical: Not on file    Non-medical: Not on file  Tobacco Use  . Smoking status: Former Smoker    Packs/day: 0.25    Years: 20.00    Pack years: 5.00    Types: Cigarettes  . Smokeless tobacco: Never Used  Substance and Sexual Activity  . Alcohol use: No    Frequency: Never  . Drug use: No  . Sexual activity: Yes  Lifestyle  . Physical activity:    Days per week: Not on file    Minutes per session: Not on file  . Stress: Not on file  Relationships  . Social connections:    Talks on phone: Not on file    Gets together: Not on file    Attends religious service: Not on file    Active member of club or organization: Not on file    Attends meetings of clubs or organizations: Not on file    Relationship status: Not on file  . Intimate partner violence:    Fear of current or ex partner: Not on file    Emotionally abused: Not on file    Physically abused: Not on file    Forced sexual activity: Not on file  Other Topics Concern  . Not on file  Social History Narrative  . Not on file    FAMILY HISTORY: Family History  Problem Relation Age of Onset  . Diabetes Mother   . Heart attack Father   . Heart attack Brother   . Breast cancer Paternal Aunt     ALLERGIES:  is allergic to oxycodone-acetaminophen; liraglutide; other; gabapentin; exenatide; and zoledronic acid.  MEDICATIONS:  Current Outpatient Medications  Medication Sig Dispense Refill  . albuterol (PROVENTIL HFA) 108 (90 Base) MCG/ACT inhaler Inhale into the lungs.    . calcitRIOL (ROCALTROL) 1 MCG/ML solution Take by mouth. Take 0.3 mLs (0.3 mcg total) by mouth once daily    . clonazePAM (KLONOPIN) 0.5 MG tablet Take 0.5 mg by mouth 3 (three) times daily as needed.    . Cyanocobalamin (B-12 COMPLIANCE INJECTION IJ)  Inject 1,000 mcg into the muscle every 30 (thirty) days.    . fluocinolone (SYNALAR) 0.025 % ointment APPLY A THIN LAYER OF OINTMENT TOPICALLY AA PRN  2  . furosemide (LASIX) 40 MG tablet Take 40 mg by mouth daily as needed.    Marland Kitchen glipiZIDE (GLUCOTROL XL) 5 MG 24 hr tablet Take 5 mg by mouth daily.    Marland Kitchen ketorolac (ACULAR) 0.5 % ophthalmic solution     . moxifloxacin (VIGAMOX) 0.5 % ophthalmic solution 1 drop 4 (four) times daily.    Marland Kitchen omeprazole (PRILOSEC) 20 MG capsule Take 20 mg by mouth as needed.    . pramipexole (MIRAPEX) 0.25 MG tablet Take 0.25 mg by mouth 3 (three) times daily with meals.     . prednisoLONE acetate (PRED FORTE) 1 % ophthalmic suspension     . primidone (MYSOLINE) 50 MG tablet Take  50 mg by mouth 2 (two) times daily.    . promethazine (PHENERGAN) 25 MG tablet Take 25 mg by mouth as needed.    . Eszopiclone 3 MG TABS Take 3 mg by mouth daily as needed.     No current facility-administered medications for this visit.      PHYSICAL EXAMINATION: ECOG PERFORMANCE STATUS: 0 - Asymptomatic Vitals:   11/15/18 1329  BP: (!) 149/89  Pulse: 93  Resp: 18  Temp: (!) 96.2 F (35.7 C)   Filed Weights   11/15/18 1329  Weight: 182 lb 14.4 oz (83 kg)    Physical Exam  Constitutional: She is oriented to person, place, and time. No distress.  HENT:  Head: Normocephalic and atraumatic.  Nose: Nose normal.  Mouth/Throat: Oropharynx is clear and moist. No oropharyngeal exudate.  Eyes: Pupils are equal, round, and reactive to light. EOM are normal. Left eye exhibits no discharge. No scleral icterus.  Neck: Normal range of motion. Neck supple. No thyromegaly present.  Cardiovascular: Normal rate and regular rhythm. Exam reveals no friction rub.  No murmur heard. Pulmonary/Chest: Effort normal and breath sounds normal. No respiratory distress. She has no wheezes. She has no rales. She exhibits no tenderness.  Abdominal: Soft. Bowel sounds are normal. She exhibits no distension  and no mass. There is no abdominal tenderness. There is no rebound.  Musculoskeletal: Normal range of motion.        General: No edema.  Lymphadenopathy:    She has no cervical adenopathy.  Neurological: She is alert and oriented to person, place, and time. No cranial nerve deficit. She exhibits normal muscle tone. Coordination normal.  Skin: Skin is warm and dry. No rash noted. She is not diaphoretic. No erythema.  Psychiatric: Mood, memory, affect and judgment normal.     LABORATORY DATA:  I have reviewed the data as listed Lab Results  Component Value Date   WBC 4.1 11/13/2018   HGB 14.0 11/13/2018   HCT 41.7 11/13/2018   MCV 85.1 11/13/2018   PLT 269 11/13/2018   Recent Labs    12/13/17 1125  NA 137  K 3.7  CL 107  CO2 22  GLUCOSE 129*  BUN 13  CREATININE 0.63  CALCIUM 9.2  GFRNONAA >60  GFRAA >60  PROT 8.1  ALBUMIN 4.2  AST 22  ALT 15  ALKPHOS 103  BILITOT 0.3    Iron/TIBC/Ferritin/ %Sat    Component Value Date/Time   IRON 109 11/13/2018 0801   TIBC 438 11/13/2018 0801   FERRITIN 40 11/13/2018 0801   IRONPCTSAT 25 11/13/2018 0801     ASSESSMENT & PLAN:  1. Iron deficiency anemia, unspecified iron deficiency anemia type   2. B12 deficiency   Labs reviewed and discussed with patient.  Hemoglobin 14. Iron panel showed slightly decreased ferritin of 40, TIBC 438, saturation ratio 25. Hold additional IV iron for now.  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  Vitamin B12 deficiency, continue parenteral B12 injections through primary care physician's office.  Positive antiparietal antibody.  All questions were answered. The patient knows to call the clinic with any problems questions or concerns. Orders Placed This Encounter  Procedures  . CBC with Differential/Platelet  . Ferritin  . Comprehensive metabolic panel  . Iron and TIBC  . Vitamin B12   Return of visit: 1 year with repeat CBC, iron, TIBC ferritin,  B12.  Earlie Server, MD, PhD Hematology Oncology [facility]  Regional Pager- [phone] 11/16/2018

## 2018-12-20 ENCOUNTER — Other Ambulatory Visit: Payer: Self-pay | Admitting: Internal Medicine

## 2018-12-20 DIAGNOSIS — Z1231 Encounter for screening mammogram for malignant neoplasm of breast: Secondary | ICD-10-CM

## 2019-02-26 ENCOUNTER — Ambulatory Visit
Admission: RE | Admit: 2019-02-26 | Discharge: 2019-02-26 | Disposition: A | Discharge status: Home / Self Care | Payer: Medicare Other | Source: Ambulatory Visit | Attending: Internal Medicine | Admitting: Internal Medicine

## 2019-02-26 ENCOUNTER — Other Ambulatory Visit: Payer: Self-pay | Admitting: Internal Medicine

## 2019-02-26 ENCOUNTER — Other Ambulatory Visit: Payer: Self-pay

## 2019-02-26 DIAGNOSIS — N6320 Unspecified lump in the left breast, unspecified quadrant: Secondary | ICD-10-CM

## 2019-02-26 DIAGNOSIS — R928 Other abnormal and inconclusive findings on diagnostic imaging of breast: Secondary | ICD-10-CM

## 2019-02-26 DIAGNOSIS — Z1231 Encounter for screening mammogram for malignant neoplasm of breast: Secondary | ICD-10-CM | POA: Insufficient documentation

## 2019-02-28 ENCOUNTER — Ambulatory Visit
Admission: RE | Admit: 2019-02-28 | Discharge: 2019-02-28 | Disposition: A | Discharge status: Home / Self Care | Payer: Medicare Other | Source: Ambulatory Visit | Attending: Internal Medicine | Admitting: Internal Medicine

## 2019-02-28 ENCOUNTER — Other Ambulatory Visit: Payer: Self-pay

## 2019-02-28 DIAGNOSIS — R928 Other abnormal and inconclusive findings on diagnostic imaging of breast: Secondary | ICD-10-CM | POA: Insufficient documentation

## 2019-08-29 ENCOUNTER — Telehealth: Payer: Self-pay | Admitting: *Deleted

## 2019-08-29 ENCOUNTER — Other Ambulatory Visit: Payer: Self-pay | Admitting: Internal Medicine

## 2019-08-29 DIAGNOSIS — R222 Localized swelling, mass and lump, trunk: Secondary | ICD-10-CM

## 2019-08-29 DIAGNOSIS — R3129 Other microscopic hematuria: Secondary | ICD-10-CM

## 2019-09-03 ENCOUNTER — Ambulatory Visit (HOSPITAL_BASED_OUTPATIENT_CLINIC_OR_DEPARTMENT_OTHER)
Admission: RE | Admit: 2019-09-03 | Discharge: 2019-09-03 | Disposition: A | Discharge status: Home / Self Care | Payer: Medicare Other | Source: Ambulatory Visit | Attending: Internal Medicine | Admitting: Internal Medicine

## 2019-09-03 ENCOUNTER — Other Ambulatory Visit: Payer: Self-pay

## 2019-09-03 DIAGNOSIS — R222 Localized swelling, mass and lump, trunk: Secondary | ICD-10-CM | POA: Diagnosis present

## 2019-09-03 DIAGNOSIS — R3129 Other microscopic hematuria: Secondary | ICD-10-CM | POA: Insufficient documentation

## 2019-09-03 MED ORDER — IOHEXOL 300 MG/ML  SOLN
100.0000 mL | Freq: Once | INTRAMUSCULAR | Status: AC | PRN
  Administered 2019-09-03: 100 mL via INTRAVENOUS

## 2019-09-04 ENCOUNTER — Encounter: Payer: Self-pay | Admitting: Oncology

## 2019-11-05 ENCOUNTER — Telehealth: Payer: Self-pay

## 2019-11-05 NOTE — Telephone Encounter (Signed)
Patient has an appt on 11/16/2019.  She cancelled the lab appt on 11/14/2019 because she had labs drawn at PCP, Dr. Olin Pia office, available for review in Care Everywhere.    There was CBC, CMP, vitamin B12 results from 08/2019.  Then Ferritin and Folate result in 09/2019 (iron & TIBC not drawn).  Can you use these labs for her 11/2019 appt or will she need more recent labs?  She is concerned about insurance covering more labs.

## 2019-11-06 NOTE — Telephone Encounter (Signed)
Pt aware.

## 2019-11-06 NOTE — Telephone Encounter (Signed)
Should be fine.  Thanks

## 2019-11-14 ENCOUNTER — Other Ambulatory Visit: Payer: PRIVATE HEALTH INSURANCE

## 2019-11-16 ENCOUNTER — Inpatient Hospital Stay: Payer: Medicare Other | Attending: Oncology | Admitting: Oncology

## 2019-11-16 ENCOUNTER — Encounter: Payer: Self-pay | Admitting: Oncology

## 2019-11-16 DIAGNOSIS — D509 Iron deficiency anemia, unspecified: Secondary | ICD-10-CM | POA: Insufficient documentation

## 2019-11-16 NOTE — Progress Notes (Signed)
HEMATOLOGY-ONCOLOGY TeleHEALTH VISIT PROGRESS NOTE  I connected with Carla Estrada on 11/16/19 at  2:15 PM EST by video enabled telemedicine visit and verified that I am speaking with the correct person using two identifiers. I discussed the limitations, risks, security and privacy concerns of performing an evaluation and management service by telemedicine and the availability of in-person appointments. I also discussed with the patient that there may be a patient responsible charge related to this service. The patient expressed understanding and agreed to proceed.   Other persons participating in the visit and their role in the encounter:  None  Patient's location: Home  Provider's location: office Chief Complaint: Follow-up for anemia   INTERVAL HISTORY Carla Estrada is a 69 y.o. female who has above history reviewed by me today presents for follow up visit for anemia Problems and complaints are listed below:  She has no new complaints today.  Feeling well at baseline.  Review of Systems  Constitutional: Negative for appetite change, chills, fatigue and fever.  HENT:   Negative for hearing loss and voice change.   Eyes: Negative for eye problems.  Respiratory: Negative for chest tightness and cough.   Cardiovascular: Negative for chest pain.  Gastrointestinal: Negative for abdominal distention, abdominal pain and blood in stool.  Endocrine: Negative for hot flashes.  Genitourinary: Negative for difficulty urinating and frequency.   Musculoskeletal: Negative for arthralgias.  Skin: Negative for itching and rash.  Neurological: Negative for extremity weakness.  Hematological: Negative for adenopathy.  Psychiatric/Behavioral: Negative for confusion.    Past Medical History:  Diagnosis Date  . Allergy   . Anemia   . Asthma   . Benign head tremor   . Diabetes mellitus without complication ([place])   . GERD (gastroesophageal reflux disease)   . History of kidney stones   .  Hypertension   . Iron deficiency anemia 12/13/2017  . Low vitamin B12 level   . Neuromuscular disorder ([place])   . Restless legs   . Thyroid disease   . Vitamin D deficiency    Past Surgical History:  Procedure Laterality Date  . ABDOMINAL HYSTERECTOMY    . BREAST EXCISIONAL BIOPSY Left   . cataract surgery  2020  . CHOLECYSTECTOMY    . COLONOSCOPY WITH PROPOFOL N/A 01/05/2018   Procedure: COLONOSCOPY WITH PROPOFOL;  Surgeon: Jonathon Bellows, MD;  Location: [facility] ENDOSCOPY;  Service: Gastroenterology;  Laterality: N/A;  . ESOPHAGOGASTRODUODENOSCOPY (EGD) WITH PROPOFOL N/A 01/05/2018   Procedure: ESOPHAGOGASTRODUODENOSCOPY (EGD) WITH PROPOFOL;  Surgeon: Jonathon Bellows, MD;  Location: [facility] ENDOSCOPY;  Service: Gastroenterology;  Laterality: N/A;  . FOOT SURGERY    . FOOT SURGERY    . GASTRIC BYPASS  2014    Family History  Problem Relation Age of Onset  . Diabetes Mother   . Heart attack Father   . Heart attack Brother   . Breast cancer Paternal Aunt     Social History   Socioeconomic History  . Marital status: Married    Spouse name: Not on file  . Number of children: Not on file  . Years of education: Not on file  . Highest education level: Not on file  Occupational History  . Not on file  Tobacco Use  . Smoking status: Former Smoker    Packs/day: 0.25    Years: 20.00    Pack years: 5.00    Types: Cigarettes  . Smokeless tobacco: Never Used  Substance and Sexual Activity  . Alcohol use: No  . Drug use:  No  . Sexual activity: Yes  Other Topics Concern  . Not on file  Social History Narrative  . Not on file   Social Determinants of Health   Financial Resource Strain:   . Difficulty of Paying Living Expenses: Not on file  Food Insecurity:   . Worried About Charity fundraiser in the Last Year: Not on file  . Ran Out of Food in the Last Year: Not on file  Transportation Needs:   . Lack of Transportation (Medical): Not on file  . Lack of Transportation (Non-Medical): Not  on file  Physical Activity:   . Days of Exercise per Week: Not on file  . Minutes of Exercise per Session: Not on file  Stress:   . Feeling of Stress : Not on file  Social Connections:   . Frequency of Communication with Friends and Family: Not on file  . Frequency of Social Gatherings with Friends and Family: Not on file  . Attends Religious Services: Not on file  . Active Member of Clubs or Organizations: Not on file  . Attends Archivist Meetings: Not on file  . Marital Status: Not on file  Intimate Partner Violence:   . Fear of Current or Ex-Partner: Not on file  . Emotionally Abused: Not on file  . Physically Abused: Not on file  . Sexually Abused: Not on file    Current Outpatient Medications on File Prior to Visit  Medication Sig Dispense Refill  . albuterol (PROVENTIL HFA) 108 (90 Base) MCG/ACT inhaler Inhale into the lungs.    Marland Kitchen atorvastatin (LIPITOR) 10 MG tablet Take by mouth.    . clonazePAM (KLONOPIN) 0.5 MG tablet Take 0.5 mg by mouth 3 (three) times daily as needed.    . Cyanocobalamin (B-12 COMPLIANCE INJECTION IJ) Inject 1,000 mcg into the muscle every 30 (thirty) days.    . Eszopiclone 3 MG TABS Take 3 mg by mouth daily as needed.    . fluocinolone (SYNALAR) 0.025 % ointment APPLY A THIN LAYER OF OINTMENT TOPICALLY AA PRN  2  . furosemide (LASIX) 40 MG tablet Take 40 mg by mouth daily as needed.    Marland Kitchen glipiZIDE (GLUCOTROL XL) 5 MG 24 hr tablet Take 5 mg by mouth daily.    Marland Kitchen omeprazole (PRILOSEC) 20 MG capsule Take 20 mg by mouth as needed.    . pramipexole (MIRAPEX) 0.25 MG tablet Take 0.25 mg by mouth 3 (three) times daily with meals.     . promethazine (PHENERGAN) 25 MG tablet Take 25 mg by mouth as needed.    . propranolol (INDERAL) 10 MG tablet TAKE ONE TABLET BY MOUTH TWICE DAILY FOR 1 WEEK THEN INCREASE TO TWO TABLETS BY MOUTH TWICE DAILY     No current facility-administered medications on file prior to visit.    Allergies  Allergen Reactions  .  Oxycodone-Acetaminophen Nausea And Vomiting    Other reaction(s): Nausea And Vomiting   . Liraglutide Nausea And Vomiting    Other reaction(s): Nausea And Vomiting   . Other Nausea And Vomiting  . Gabapentin Other (See Comments)    Pt states cant tolerate, tried for restless legs and started having double vision, mental status changes, out of body feeling  . Exenatide Nausea Only and Nausea And Vomiting    Per outside record   . Zoledronic Acid Nausea Only       Observations/Objective: Today's Vitals   11/16/19 1404  PainSc: 0-No pain   There is no height  or weight on file to calculate BMI.  Physical Exam  Constitutional: No distress.  Neurological: She is alert.    CBC    Component Value Date/Time   WBC 4.1 11/13/2018 0801   RBC 4.90 11/13/2018 0801   HGB 14.0 11/13/2018 0801   HCT 41.7 11/13/2018 0801   PLT 269 11/13/2018 0801   MCV 85.1 11/13/2018 0801   MCH 28.6 11/13/2018 0801   MCHC 33.6 11/13/2018 0801   RDW 12.6 11/13/2018 0801   LYMPHSABS 2.0 11/13/2018 0801   MONOABS 0.2 11/13/2018 0801   EOSABS 0.2 11/13/2018 0801   BASOSABS 0.1 11/13/2018 0801    CMP     Component Value Date/Time   NA 137 12/13/2017 1125   K 3.7 12/13/2017 1125   CL 107 12/13/2017 1125   CO2 22 12/13/2017 1125   GLUCOSE 129 (H) 12/13/2017 1125   BUN 13 12/13/2017 1125   CREATININE 0.63 12/13/2017 1125   CALCIUM 9.2 12/13/2017 1125   PROT 8.1 12/13/2017 1125   ALBUMIN 4.2 12/13/2017 1125   AST 22 12/13/2017 1125   ALT 15 12/13/2017 1125   ALKPHOS 103 12/13/2017 1125   BILITOT 0.3 12/13/2017 1125   GFRNONAA >60 12/13/2017 1125   GFRAA >60 12/13/2017 1125     Assessment and Plan: 1. Iron deficiency anemia, unspecified iron deficiency anemia type     Labs reviewed and discussed with patient. Patient has stable normal hemoglobin at 14, Iron panel showed stable adequate iron stores. No need for any additional IV iron or oral iron supplementation at this point Given that  patient has been stable for the past 1 year, she will be discharged from our clinic and after follow-up with primary care provider. Follow Up Instructions: No need for follow-up.   I discussed the assessment and treatment plan with the patient. The patient was provided an opportunity to ask questions and all were answered. The patient agreed with the plan and demonstrated an understanding of the instructions.  The patient was advised to call back or seek an in-person evaluation if the symptoms worsen or if the condition fails to improve as anticipated.    Earlie Server, MD 11/16/2019 10:10 PM

## 2019-11-16 NOTE — Progress Notes (Signed)
Patient verified using two identifiers for virtual visit via telephone today.  Patient does not offer any problems today.  

## 2020-01-31 DIAGNOSIS — M23301 Other meniscus derangements, unspecified lateral meniscus, left knee: Secondary | ICD-10-CM | POA: Insufficient documentation

## 2020-05-18 IMAGING — CT CT ABD-PELV W/ CM
2 of 5 series · 16 of 46 positions shown, 18 images · IV contrast (omnipaque)
Comparison: 12/06/2016

CLINICAL DATA: Lower abdominal pain for 2 weeks

EXAM:
CT ABDOMEN AND PELVIS WITH CONTRAST
TECHNIQUE: Multidetector CT imaging of the abdomen and pelvis was performed
using the standard protocol following bolus administration of
intravenous contrast.
CONTRAST:  100mL OMNIPAQUE IOHEXOL 300 MG/ML  SOLN

[Series 2: axial st · axial · 0.84mm/px · z∈[-584,-120]mm · 13 of 105 slices shown, 15 images]
[im 6/105  soft-tissue]
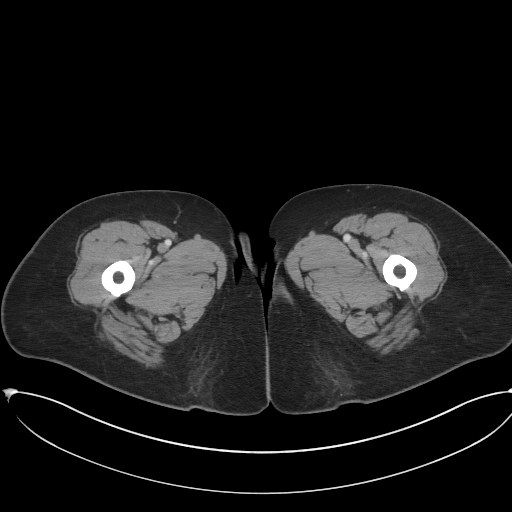
[im 6/105  bone]
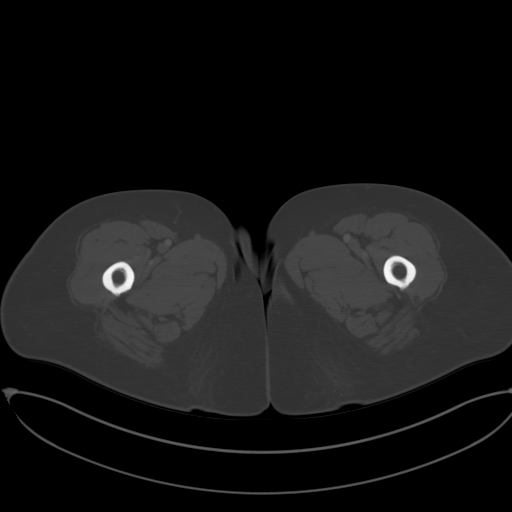
[im 12/105  soft-tissue]
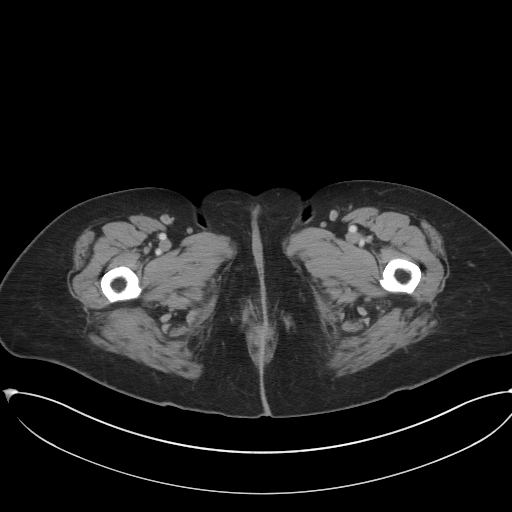
[im 24/105  soft-tissue]
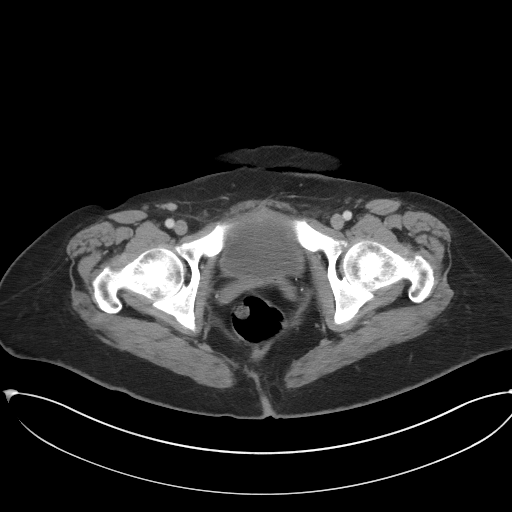
[im 29/105  soft-tissue]
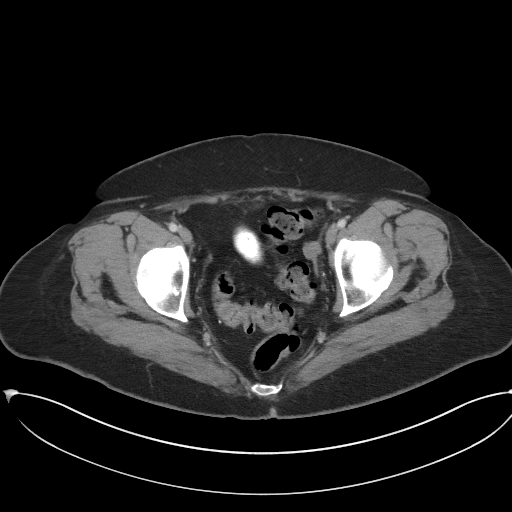
[im 35/105  soft-tissue]
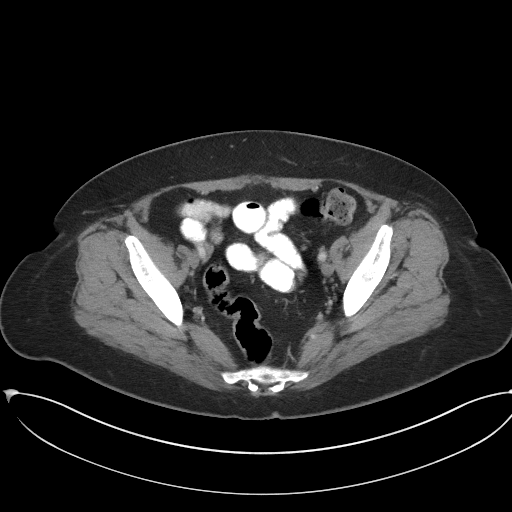
[im 47/105  soft-tissue]
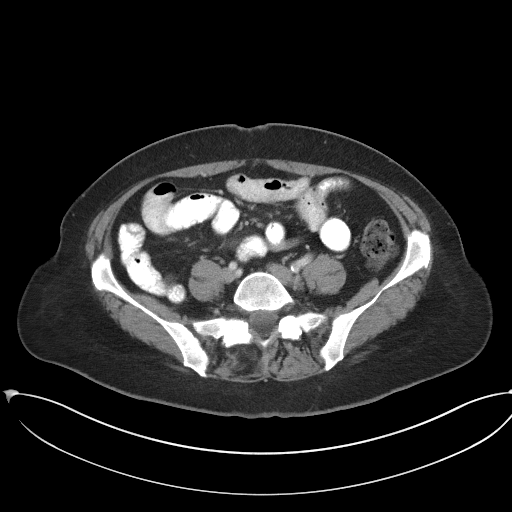
[im 53/105  soft-tissue]
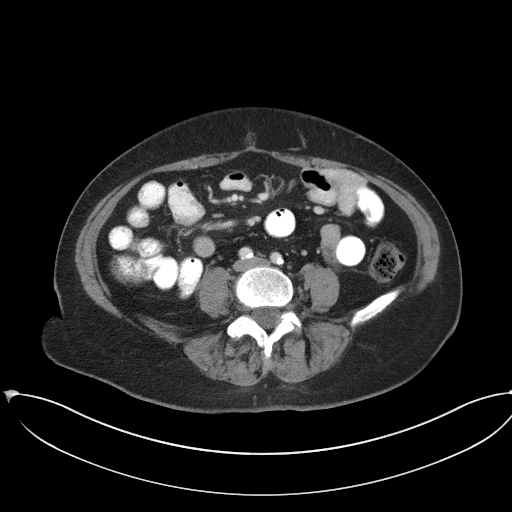
[im 58/105  soft-tissue]
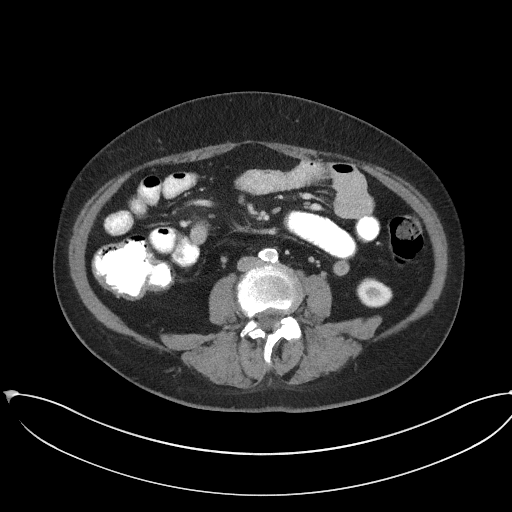
[im 70/105  soft-tissue]
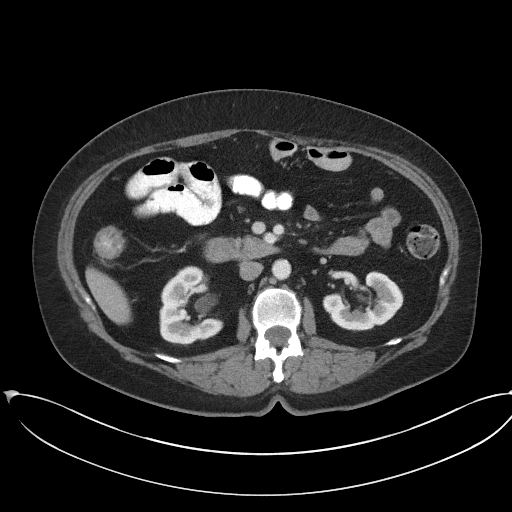
[im 70/105  bone]
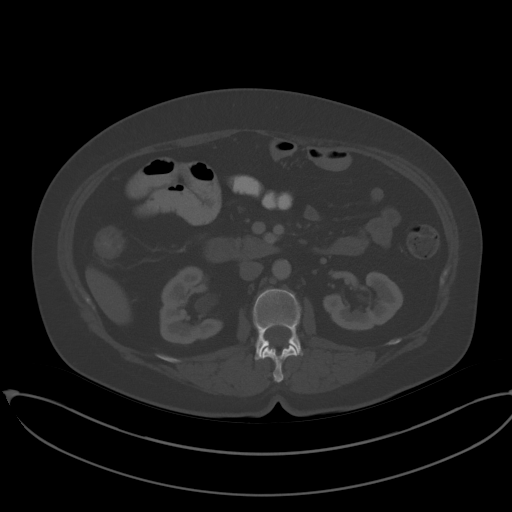
[im 76/105  soft-tissue]
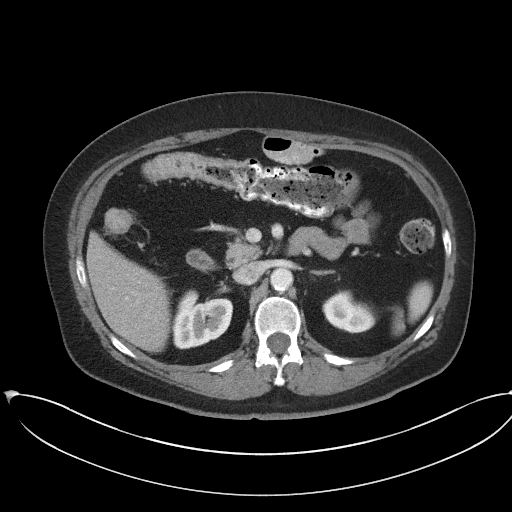
[im 81/105  soft-tissue]
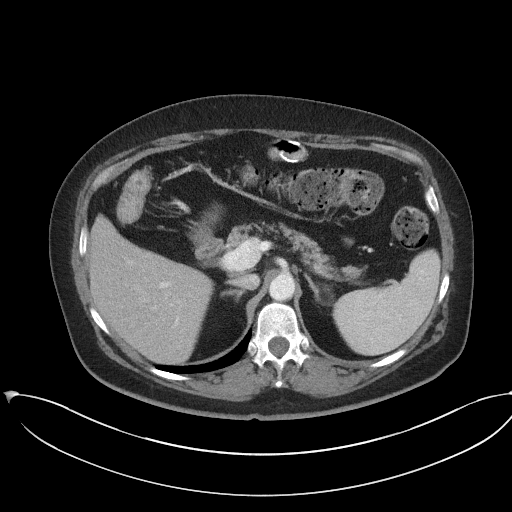
[im 93/105  soft-tissue]
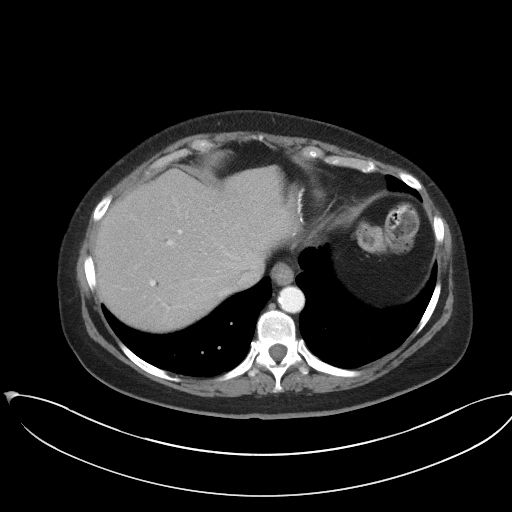
[im 99/105  soft-tissue]
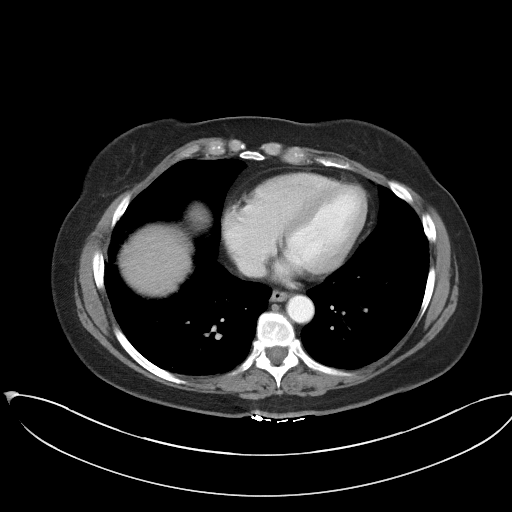

[Series 4: coronal st · coronal · 0.87mm/px · 3 of 101 slices shown]
[im 34/101  soft-tissue]
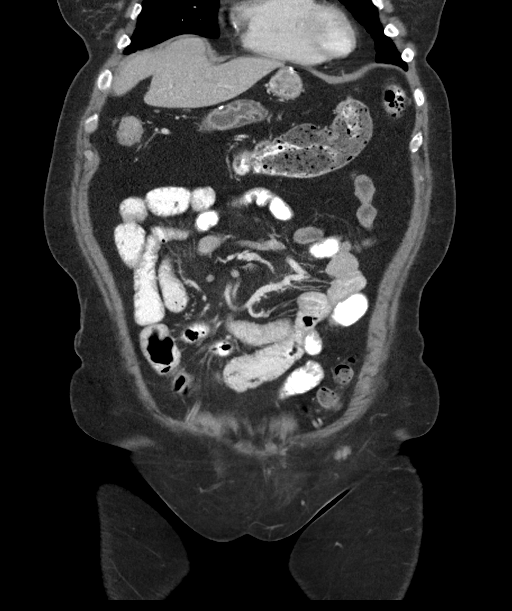
[im 45/101  soft-tissue]
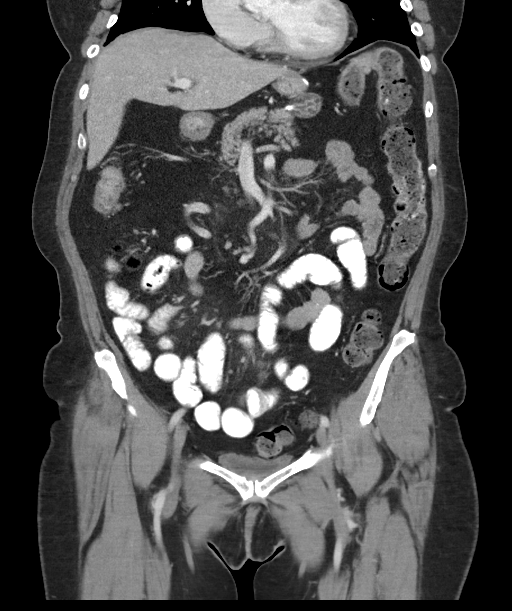
[im 56/101  soft-tissue]
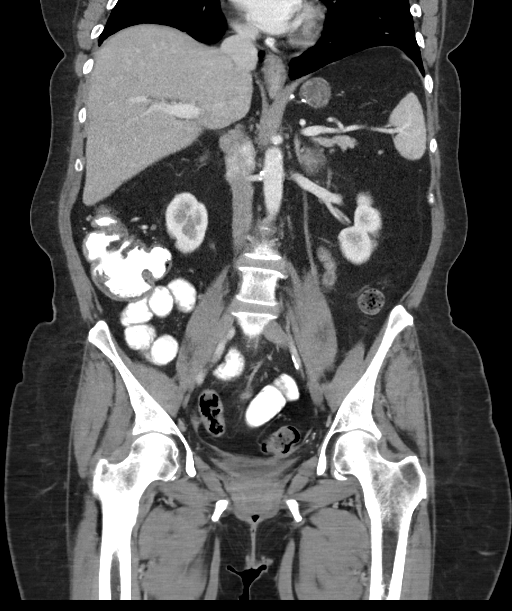

[16 of 46 positions shown; findings below may reference images not displayed]

FINDINGS: Lower chest: Lung bases are free of acute infiltrate or sizable
effusion.

Hepatobiliary: No focal liver abnormality is seen. Status post
cholecystectomy. No biliary dilatation.

Pancreas: Unremarkable. No pancreatic ductal dilatation or
surrounding inflammatory changes.

Spleen: Normal in size without focal abnormality.

Adrenals/Urinary Tract: Adrenal glands are within normal limits.
Kidneys demonstrate a normal enhancement pattern bilaterally. No
obstructive changes are seen. The bladder is partially distended.

Stomach/Bowel: Colon is within normal limits. The appendix is not
well visualized. Stomach shows changes of prior sleeve surgery.
Small bowel demonstrates no significant dilatation. Contrast
material is noted predominately within the distal small bowel
extending into the colon.

Vascular/Lymphatic: Aortic atherosclerosis. No enlarged abdominal or
pelvic lymph nodes.

Reproductive: Status post hysterectomy. No adnexal masses.

Other: No abdominal wall hernia or abnormality. No abdominopelvic
ascites.

Musculoskeletal: No acute or significant osseous findings.
IMPRESSION: No acute abnormality is noted.

## 2021-03-13 ENCOUNTER — Telehealth: Payer: Self-pay

## 2021-03-13 ENCOUNTER — Other Ambulatory Visit: Payer: Self-pay

## 2021-03-13 MED ORDER — SUPREP BOWEL PREP KIT 17.5-3.13-1.6 GM/177ML PO SOLN: 1.0000 | ORAL | 0 refills | Status: DC

## 2021-03-13 NOTE — Telephone Encounter (Signed)
Gastroenterology Pre-Procedure Review  Request Date: 03/17/21 Requesting Physician: Dr. Bonna Gains  PATIENT REVIEW QUESTIONS: The patient responded to the following health history questions as indicated:    1. Are you having any GI issues? no 2. Do you have a personal history of Polyps? yes (01/05/2018 last colonoscopy) 3. Do you have a family history of Colon Cancer or Polyps? no 4. Diabetes Mellitus? yes (Type 2) 5. Joint replacements in the past 12 months?no 6. Major health problems in the past 3 months?no 7. Any artificial heart valves, MVP, or defibrillator?no    MEDICATIONS & ALLERGIES:    Patient reports the following regarding taking any anticoagulation/antiplatelet therapy:   Plavix, Coumadin, Eliquis, Xarelto, Lovenox, Pradaxa, Brilinta, or Effient? no Aspirin? no  Patient confirms/reports the following medications:  Current Outpatient Medications  Medication Sig Dispense Refill  . albuterol (PROVENTIL HFA) 108 (90 Base) MCG/ACT inhaler Inhale into the lungs.    Marland Kitchen atorvastatin (LIPITOR) 10 MG tablet Take by mouth.    . clonazePAM (KLONOPIN) 0.5 MG tablet Take 0.5 mg by mouth 3 (three) times daily as needed.    . Cyanocobalamin (B-12 COMPLIANCE INJECTION IJ) Inject 1,000 mcg into the muscle every 30 (thirty) days.    . Eszopiclone 3 MG TABS Take 3 mg by mouth daily as needed.    . fluocinolone (SYNALAR) 0.025 % ointment APPLY A THIN LAYER OF OINTMENT TOPICALLY AA PRN  2  . furosemide (LASIX) 40 MG tablet Take 40 mg by mouth daily as needed.    Marland Kitchen glipiZIDE (GLUCOTROL XL) 5 MG 24 hr tablet Take 5 mg by mouth daily.    . Na Sulfate-K Sulfate-Mg Sulf (SUPREP BOWEL PREP KIT) 17.5-3.13-1.6 GM/177ML SOLN Take 1 kit by mouth as directed. 354 mL 0  . omeprazole (PRILOSEC) 20 MG capsule Take 20 mg by mouth as needed.    . pramipexole (MIRAPEX) 0.25 MG tablet Take 0.25 mg by mouth 3 (three) times daily with meals.     . promethazine (PHENERGAN) 25 MG tablet Take 25 mg by mouth as needed.     . propranolol (INDERAL) 10 MG tablet TAKE ONE TABLET BY MOUTH TWICE DAILY FOR 1 WEEK THEN INCREASE TO TWO TABLETS BY MOUTH TWICE DAILY     No current facility-administered medications for this visit.    Patient confirms/reports the following allergies:  Allergies  Allergen Reactions  . Oxycodone-Acetaminophen Nausea And Vomiting    Other reaction(s): Nausea And Vomiting   . Liraglutide Nausea And Vomiting    Other reaction(s): Nausea And Vomiting   . Other Nausea And Vomiting  . Gabapentin Other (See Comments)    Pt states cant tolerate, tried for restless legs and started having double vision, mental status changes, out of body feeling  . Exenatide Nausea Only and Nausea And Vomiting    Per outside record   . Zoledronic Acid Nausea Only    No orders of the defined types were placed in this encounter.   AUTHORIZATION INFORMATION Primary Insurance: 1D#: Group #:  Secondary Insurance: 1D#: Group #:  SCHEDULE INFORMATION: Date:  Time: Location:

## 2021-03-17 ENCOUNTER — Encounter
Admission: RE | Disposition: A | Discharge status: Home / Self Care | Payer: Self-pay | Source: Home / Self Care | Attending: Gastroenterology

## 2021-03-17 ENCOUNTER — Encounter: Payer: Self-pay | Admitting: Gastroenterology

## 2021-03-17 ENCOUNTER — Ambulatory Visit: Payer: Medicare Other | Admitting: Anesthesiology

## 2021-03-17 ENCOUNTER — Ambulatory Visit
Admission: RE | Admit: 2021-03-17 | Discharge: 2021-03-17 | Disposition: A | Discharge status: Home / Self Care | Payer: Medicare Other | Attending: Gastroenterology | Admitting: Gastroenterology

## 2021-03-17 DIAGNOSIS — Z885 Allergy status to narcotic agent status: Secondary | ICD-10-CM | POA: Insufficient documentation

## 2021-03-17 DIAGNOSIS — K635 Polyp of colon: Secondary | ICD-10-CM | POA: Insufficient documentation

## 2021-03-17 DIAGNOSIS — Z1211 Encounter for screening for malignant neoplasm of colon: Secondary | ICD-10-CM | POA: Diagnosis not present

## 2021-03-17 DIAGNOSIS — Z7984 Long term (current) use of oral hypoglycemic drugs: Secondary | ICD-10-CM | POA: Insufficient documentation

## 2021-03-17 DIAGNOSIS — Z87891 Personal history of nicotine dependence: Secondary | ICD-10-CM | POA: Insufficient documentation

## 2021-03-17 DIAGNOSIS — Z86010 Personal history of colonic polyps: Secondary | ICD-10-CM | POA: Diagnosis not present

## 2021-03-17 DIAGNOSIS — Z888 Allergy status to other drugs, medicaments and biological substances status: Secondary | ICD-10-CM | POA: Insufficient documentation

## 2021-03-17 DIAGNOSIS — Z9884 Bariatric surgery status: Secondary | ICD-10-CM | POA: Insufficient documentation

## 2021-03-17 HISTORY — PX: COLONOSCOPY: SHX5424

## 2021-03-17 LAB — GLUCOSE, CAPILLARY: Glucose-Capillary: 136 mg/dL — ABNORMAL HIGH (ref 70–99)

## 2021-03-17 SURGERY — COLONOSCOPY
Anesthesia: General

## 2021-03-17 MED ORDER — PROPOFOL 10 MG/ML IV BOLUS
INTRAVENOUS | Status: DC | PRN
  Administered 2021-03-17: 50 mg via INTRAVENOUS

## 2021-03-17 MED ORDER — PROPOFOL 10 MG/ML IV BOLUS
INTRAVENOUS | Status: AC
  Filled 2021-03-17: qty 20

## 2021-03-17 MED ORDER — SODIUM CHLORIDE 0.9 % IV SOLN: INTRAVENOUS | Status: DC

## 2021-03-17 MED ORDER — PROPOFOL 500 MG/50ML IV EMUL
INTRAVENOUS | Status: DC | PRN
  Administered 2021-03-17: 180 ug/kg/min via INTRAVENOUS

## 2021-03-17 MED ORDER — ONDANSETRON HCL 4 MG/2ML IJ SOLN
INTRAMUSCULAR | Status: DC | PRN
  Administered 2021-03-17: 4 mg via INTRAVENOUS

## 2021-03-17 MED ORDER — LIDOCAINE HCL (PF) 2 % IJ SOLN
INTRAMUSCULAR | Status: AC
  Filled 2021-03-17: qty 5

## 2021-03-17 MED ORDER — PROPOFOL 500 MG/50ML IV EMUL
INTRAVENOUS | Status: AC
  Filled 2021-03-17: qty 50

## 2021-03-17 NOTE — Anesthesia Procedure Notes (Signed)
Date/Time: 03/17/2021 10:15 AM Performed by: Allean Found, CRNA Pre-anesthesia Checklist: Patient identified, Emergency Drugs available, Suction available, Patient being monitored and Timeout performed Patient Re-evaluated:Patient Re-evaluated prior to induction Oxygen Delivery Method: Circle system utilized, Simple face mask and Nasal cannula Placement Confirmation: positive ETCO2

## 2021-03-17 NOTE — Op Note (Signed)
[facility] Gastroenterology Patient Name: Carla Estrada Procedure Date: 03/17/2021 10:19 AM MRN: [id number] Account #: [id number] Date of Birth: 1951/03/09 Admit Type: Outpatient Age: 70 Room: [facility] ENDO ROOM 3 Gender: Female Note Status: Finalized Procedure:             Colonoscopy Indications:           High risk colon cancer surveillance: Personal history                         of colonic polyps Providers:             Nakyia Dau B. Bonna Gains MD, MD Referring MD:          Ramonita Lab, MD (Referring MD) Medicines:             Monitored Anesthesia Care Complications:         No immediate complications. Procedure:             Pre-Anesthesia Assessment:                        - ASA Grade Assessment: II - A patient with mild                         systemic disease.                        - Prior to the procedure, a History and Physical was                         performed, and patient medications, allergies and                         sensitivities were reviewed. The patient's tolerance                         of previous anesthesia was reviewed.                        - The risks and benefits of the procedure and the                         sedation options and risks were discussed with the                         patient. All questions were answered and informed                         consent was obtained.                        - Patient identification and proposed procedure were                         verified prior to the procedure by the physician, the                         nurse, the anesthesiologist, the anesthetist and the                         technician. The procedure was  verified in the                         procedure room.                        After obtaining informed consent, the colonoscope was                         passed under direct vision. Throughout the procedure,                         the patient's blood pressure, pulse, and oxygen                          saturations were monitored continuously. The                         Colonoscope was introduced through the anus and                         advanced to the the cecum, identified by appendiceal                         orifice and ileocecal valve. The colonoscopy was                         performed with ease. The patient tolerated the                         procedure well. The quality of the bowel preparation                         was fair. Findings:      The perianal and digital rectal examinations were normal.      A 4 mm polyp was found in the sigmoid colon. The polyp was sessile. The       polyp was removed with a jumbo cold forceps. Resection and retrieval       were complete.      The exam was otherwise without abnormality.      The rectum, sigmoid colon, descending colon, transverse colon, ascending       colon and cecum appeared normal.      The retroflexed view of the distal rectum and anal verge was normal and       showed no anal or rectal abnormalities. Impression:            - Preparation of the colon was fair.                        - One 4 mm polyp in the sigmoid colon, removed with a                         jumbo cold forceps. Resected and retrieved.                        - The examination was otherwise normal.                        - The rectum, sigmoid colon,  descending colon,                         transverse colon, ascending colon and cecum are normal.                        - The distal rectum and anal verge are normal on                         retroflexion view. Recommendation:        - Await pathology results.                        - Discharge patient to home (with escort).                        - Advance diet as tolerated.                        - Continue present medications.                        - Repeat colonoscopy in 1 year, with 2 day prep.                        - The findings and recommendations were discussed with                          the patient.                        - The findings and recommendations were discussed with                         the patient's family.                        - Return to primary care physician as previously                         scheduled. Procedure Code(s):     --- Professional ---                        [phone], Colonoscopy, flexible; with biopsy, single or                         multiple Diagnosis Code(s):     --- Professional ---                        Z86.010, Personal history of colonic polyps                        K63.5, Polyp of colon CPT copyright 2019 American Medical Association. All rights reserved. The codes documented in this report are preliminary and upon coder review may  be revised to meet current compliance requirements.  Vonda Antigua, MD Margretta Sidle B. Bonna Gains MD, MD 03/17/2021 11:05:13 AM This report has been signed electronically. Number of Addenda: 0 Note Initiated On: 03/17/2021 10:19 AM Scope Withdrawal Time: 0 hours 16 minutes 45 seconds  Total Procedure Duration: 0 hours 26 minutes 52  seconds  Estimated Blood Loss:  Estimated blood loss: none.      [facility]

## 2021-03-17 NOTE — Anesthesia Preprocedure Evaluation (Signed)
Anesthesia Evaluation  Patient identified by MRN, date of birth, ID band Patient awake    Reviewed: Allergy & Precautions, NPO status , Patient's Chart, lab work & pertinent test results  History of Anesthesia Complications Negative for: history of anesthetic complications  Airway Mallampati: II  TM Distance: >3 FB Neck ROM: Full    Dental no notable dental hx. (+) Teeth Intact   Pulmonary neg pulmonary ROS, neg sleep apnea, neg COPD, Patient abstained from smoking.Not current smoker, former smoker,    Pulmonary exam normal breath sounds clear to auscultation       Cardiovascular Exercise Tolerance: Good METShypertension, (-) CAD and (-) Past MI (-) dysrhythmias  Rhythm:Regular Rate:Normal - Systolic murmurs    Neuro/Psych  Neuromuscular disease negative psych ROS   GI/Hepatic GERD  ,(+)     (-) substance abuse  ,   Endo/Other  diabetes  Renal/GU negative Renal ROS     Musculoskeletal  (+) Arthritis ,   Abdominal   Peds  Hematology  (+) anemia ,   Anesthesia Other Findings Past Medical History: No date: Allergy No date: Anemia No date: Asthma No date: Benign head tremor No date: Diabetes mellitus without complication (HCC) No date: GERD (gastroesophageal reflux disease) No date: History of kidney stones No date: Hypertension 12/13/2017: Iron deficiency anemia No date: Low vitamin B12 level No date: Neuromuscular disorder (HCC) No date: Restless legs No date: Thyroid disease No date: Vitamin D deficiency  Reproductive/Obstetrics                             Anesthesia Physical Anesthesia Plan  ASA: II  Anesthesia Plan: General   Post-op Pain Management:    Induction: Intravenous  PONV Risk Score and Plan: 3 and Ondansetron, Propofol infusion and TIVA  Airway Management Planned: Nasal Cannula  Additional Equipment: None  Intra-op Plan:   Post-operative Plan:    Informed Consent: I have reviewed the patients History and Physical, chart, labs and discussed the procedure including the risks, benefits and alternatives for the proposed anesthesia with the patient or authorized representative who has indicated his/her understanding and acceptance.     Dental advisory given  Plan Discussed with: CRNA and Surgeon  Anesthesia Plan Comments: (Discussed risks of anesthesia with patient, including possibility of difficulty with spontaneous ventilation under anesthesia necessitating airway intervention, PONV, and rare risks such as cardiac or respiratory or neurological events. Patient understands.)        Anesthesia Quick Evaluation

## 2021-03-17 NOTE — H&P (Addendum)
Vonda Antigua, MD [address], [place], [place], [place], [address] [address], [place], [place], [place], [address] Phone: [phone]  Fax: [phone]  Primary Care Physician:  Adin Hector, MD   Pre-Procedure History & Physical: HPI:  Carla Estrada is a 70 y.o. female is here for a colonoscopy.   Past Medical History:  Diagnosis Date  . Allergy   . Anemia   . Asthma   . Benign head tremor   . Diabetes mellitus without complication ([place])   . GERD (gastroesophageal reflux disease)   . History of kidney stones   . Hypertension   . Iron deficiency anemia 12/13/2017  . Low vitamin B12 level   . Neuromuscular disorder ([place])   . Restless legs   . Thyroid disease   . Vitamin D deficiency     Past Surgical History:  Procedure Laterality Date  . ABDOMINAL HYSTERECTOMY    . BREAST EXCISIONAL BIOPSY Left   . cataract surgery  2020  . CHOLECYSTECTOMY    . COLONOSCOPY WITH PROPOFOL N/A 01/05/2018   Procedure: COLONOSCOPY WITH PROPOFOL;  Surgeon: Jonathon Bellows, MD;  Location[facility] ENDOSCOPY;  Service: Gastroenterology;  Laterality: N/A;  . ESOPHAGOGASTRODUODENOSCOPY (EGD) WITH PROPOFOL N/A 01/05/2018   Procedure: ESOPHAGOGASTRODUODENOSCOPY (EGD) WITH PROPOFOL;  Surgeon: Jonathon Bellows, MD;  Location: [facility] ENDOSCOPY;  Service: Gastroenterology;  Laterality: N/A;  . FOOT SURGERY    . FOOT SURGERY    . GASTRIC BYPASS  2014    Prior to Admission medications   Medication Sig Start Date End Date Taking? Authorizing Provider  Cyanocobalamin (B-12 COMPLIANCE INJECTION IJ) Inject 1,000 mcg into the muscle every 30 (thirty) days. 11/10/16  Yes [provider]  fluocinolone (SYNALAR) 0.025 % ointment APPLY A THIN LAYER OF OINTMENT TOPICALLY AA PRN 06/20/18  Yes [provider]  omeprazole (PRILOSEC) 20 MG capsule Take 20 mg by mouth as needed. 01/10/17  Yes [provider]  pramipexole (MIRAPEX) 0.25 MG tablet Take 0.25 mg by mouth 3 (three) times daily  with meals.  07/15/17  Yes [provider]  PRIMIDONE PO Take by mouth in the morning and at bedtime.   Yes [provider]  propranolol (INDERAL) 10 MG tablet TAKE ONE TABLET BY MOUTH TWICE DAILY FOR 1 WEEK THEN INCREASE TO TWO TABLETS BY MOUTH TWICE DAILY 11/06/19  Yes [provider]  albuterol (VENTOLIN HFA) 108 (90 Base) MCG/ACT inhaler Inhale into the lungs. Patient not taking: Reported on 03/17/2021 12/06/17   [provider]  atorvastatin (LIPITOR) 10 MG tablet Take by mouth. 08/29/19 08/28/20  [provider]  clonazePAM (KLONOPIN) 0.5 MG tablet Take 0.5 mg by mouth 3 (three) times daily as needed. Patient not taking: Reported on 03/17/2021 07/04/17   [provider]  Eszopiclone 3 MG TABS Take 3 mg by mouth daily as needed. 10/05/17 11/16/19  [provider]  furosemide (LASIX) 40 MG tablet Take 40 mg by mouth daily as needed. 07/31/18 11/16/19  [provider]  glipiZIDE (GLUCOTROL XL) 5 MG 24 hr tablet Take 5 mg by mouth daily. 07/31/18 11/16/19  [provider]  Na Sulfate-K Sulfate-Mg Sulf (SUPREP BOWEL PREP KIT) 17.5-3.13-1.6 GM/177ML SOLN Take 1 kit by mouth as directed. 03/13/21   Virgel Manifold, MD  promethazine (PHENERGAN) 25 MG tablet Take 25 mg by mouth as needed. Patient not taking: Reported on 03/17/2021 11/10/16   [provider]    Allergies as of 03/12/2021 - Review Complete 11/16/2019  Allergen  Reaction Noted  . Oxycodone-acetaminophen Nausea And Vomiting 02/01/2011  . Liraglutide Nausea And Vomiting 02/01/2011  . Other Nausea And Vomiting 02/01/2011  . Gabapentin Other (See Comments) 09/09/2017  . Exenatide Nausea Only and Nausea And Vomiting 05/27/2014  . Zoledronic acid Nausea Only 09/26/2014    Family History  Problem Relation Age of Onset  . Diabetes Mother   . Heart attack Father   . Heart attack Brother   . Breast cancer Paternal Aunt     Social History   Socioeconomic  History  . Marital status: Married    Spouse name: Not on file  . Number of children: Not on file  . Years of education: Not on file  . Highest education level: Not on file  Occupational History  . Not on file  Tobacco Use  . Smoking status: Former Smoker    Packs/day: 0.25    Years: 20.00    Pack years: 5.00    Types: Cigarettes  . Smokeless tobacco: Never Used  Vaping Use  . Vaping Use: Every day  . Start date: 10/11/2016  Substance and Sexual Activity  . Alcohol use: No  . Drug use: No  . Sexual activity: Yes  Other Topics Concern  . Not on file  Social History Narrative  . Not on file   Social Determinants of Health   Financial Resource Strain: Not on file  Food Insecurity: Not on file  Transportation Needs: Not on file  Physical Activity: Not on file  Stress: Not on file  Social Connections: Not on file  Intimate Partner Violence: Not on file    Review of Systems: See HPI, otherwise negative ROS  Physical Exam: BP (!) 184/86   Pulse (!) 56   Temp (!) 96.9 F (36.1 C) (Temporal)   Resp 18   Ht _0  (1.575 m)   Wt 80.3 kg   SpO2 100%   BMI 32.37 kg/m  General:   Alert,  pleasant and cooperative in NAD Head:  Normocephalic and atraumatic. Neck:  Supple; no masses or thyromegaly. Lungs:  Clear throughout to auscultation, normal respiratory effort.    Heart:  +S1, +S2, Regular rate and rhythm, No edema. Abdomen:  Soft, nontender and nondistended. Normal bowel sounds, without guarding, and without rebound.   Neurologic:  Alert and  oriented x4;  grossly normal neurologically.  Impression/Plan: Carla Estrada is here for a colonoscopy to be performed for history of adenoma polyps in 2019. Pt was previously seen by Dr. Vicente Males for iron deficiency anemia and a capsule study was recommended in the past and it appears this was never done. Pt was advised to follow up with Dr. Vicente Males for this as well.  Risks, benefits, limitations, and alternatives regarding   colonoscopy have been reviewed with the patient.  Questions have been answered.  All parties agreeable.   Virgel Manifold, MD  03/17/2021, 9:21 AM

## 2021-03-17 NOTE — Transfer of Care (Signed)
Immediate Anesthesia Transfer of Care Note  Patient: Carla Estrada  Procedure(s) Performed: COLONOSCOPY (N/A )  Patient Location: PACU  Anesthesia Type:General  Level of Consciousness: awake and alert   Airway & Oxygen Therapy: Patient Spontanous Breathing and Patient connected to nasal cannula oxygen  Post-op Assessment: Report given to RN and Post -op Vital signs reviewed and stable  Post vital signs: Reviewed and stable  Last Vitals:  Vitals Value Taken Time  BP 127/66 03/17/21 1103  Temp 36.3 C 03/17/21 1102  Pulse 57 03/17/21 1107  Resp 14 03/17/21 1107  SpO2 99 % 03/17/21 1107  Vitals shown include unvalidated device data.  Last Pain:  Vitals:   03/17/21 1102  TempSrc: Temporal  PainSc: 0-No pain         Complications: No complications documented.

## 2021-03-17 NOTE — Anesthesia Postprocedure Evaluation (Signed)
Anesthesia Post Note  Patient: Stehanie Ekstrom  Procedure(s) Performed: COLONOSCOPY (N/A )  Patient location during evaluation: Endoscopy Anesthesia Type: General Level of consciousness: awake and alert Pain management: pain level controlled Vital Signs Assessment: post-procedure vital signs reviewed and stable Respiratory status: spontaneous breathing, nonlabored ventilation, respiratory function stable and patient connected to nasal cannula oxygen Cardiovascular status: blood pressure returned to baseline and stable Postop Assessment: no apparent nausea or vomiting Anesthetic complications: no   No complications documented.   Last Vitals:  Vitals:   03/17/21 1102 03/17/21 1122  BP: 127/66 (!) 170/76  Pulse: 63   Resp: (!) 21   Temp: (!) 36.3 C   SpO2: 97%     Last Pain:  Vitals:   03/17/21 1122  TempSrc:   PainSc: 0-No pain                 Arita Miss

## 2021-03-18 ENCOUNTER — Encounter: Payer: Self-pay | Admitting: Gastroenterology

## 2021-03-18 ENCOUNTER — Telehealth: Payer: Self-pay | Admitting: *Deleted

## 2021-03-18 ENCOUNTER — Telehealth: Payer: Self-pay | Admitting: Gastroenterology

## 2021-03-18 LAB — SURGICAL PATHOLOGY

## 2021-03-18 NOTE — Telephone Encounter (Signed)
Received the following message from Dr. Bonna Gains: This patient saw Dr. Vicente Males for iron deficiency anemia before and never followed up for capsule study that was recommended at that time. I saw her for outpatient surveillance colonoscopy that got scheduled with me today. But I have advised her to follow up in clinic to reassess if further workup needs to be done since she did not follow up after 2019. Can you please schedule for follow up with Dr. Vicente Males. Thank you.   Called patient no answer. LVM for patient to return call for appointment.

## 2021-03-18 NOTE — Telephone Encounter (Signed)
Call for results

## 2021-03-18 NOTE — Telephone Encounter (Signed)
Has not been interpreted yet

## 2023-05-26 ENCOUNTER — Other Ambulatory Visit: Payer: Self-pay | Admitting: Internal Medicine

## 2023-05-26 DIAGNOSIS — I6523 Occlusion and stenosis of bilateral carotid arteries: Secondary | ICD-10-CM

## 2023-05-27 ENCOUNTER — Telehealth: Payer: Self-pay | Admitting: Family Medicine

## 2023-05-27 NOTE — Telephone Encounter (Signed)
Patient called in to establish care with you. Her husband is you patient, Carla Estrada DOB: [birth year]

## 2023-05-31 LAB — CBC AND DIFFERENTIAL: Hemoglobin: 8.4 — AB (ref 12.0–16.0)

## 2023-06-01 ENCOUNTER — Encounter: Payer: Self-pay | Admitting: Oncology

## 2023-06-02 ENCOUNTER — Ambulatory Visit (INDEPENDENT_AMBULATORY_CARE_PROVIDER_SITE_OTHER): Payer: Medicare Other | Admitting: Family Medicine

## 2023-06-02 ENCOUNTER — Encounter: Payer: Self-pay | Admitting: Family Medicine

## 2023-06-02 ENCOUNTER — Ambulatory Visit: Payer: Medicare Other

## 2023-06-02 VITALS — BP 143/84 | HR 82 | Ht 62.0 in | Wt 175.0 lb

## 2023-06-02 DIAGNOSIS — E785 Hyperlipidemia, unspecified: Secondary | ICD-10-CM

## 2023-06-02 DIAGNOSIS — R221 Localized swelling, mass and lump, neck: Secondary | ICD-10-CM | POA: Diagnosis not present

## 2023-06-02 DIAGNOSIS — E538 Deficiency of other specified B group vitamins: Secondary | ICD-10-CM | POA: Diagnosis not present

## 2023-06-02 DIAGNOSIS — G250 Essential tremor: Secondary | ICD-10-CM

## 2023-06-02 DIAGNOSIS — Z13820 Encounter for screening for osteoporosis: Secondary | ICD-10-CM

## 2023-06-02 DIAGNOSIS — E119 Type 2 diabetes mellitus without complications: Secondary | ICD-10-CM

## 2023-06-02 DIAGNOSIS — Z780 Asymptomatic menopausal state: Secondary | ICD-10-CM

## 2023-06-02 DIAGNOSIS — G2581 Restless legs syndrome: Secondary | ICD-10-CM | POA: Diagnosis not present

## 2023-06-02 DIAGNOSIS — E211 Secondary hyperparathyroidism, not elsewhere classified: Secondary | ICD-10-CM

## 2023-06-02 DIAGNOSIS — K909 Intestinal malabsorption, unspecified: Secondary | ICD-10-CM

## 2023-06-02 DIAGNOSIS — Z86010 Personal history of colonic polyps: Secondary | ICD-10-CM

## 2023-06-02 DIAGNOSIS — I6523 Occlusion and stenosis of bilateral carotid arteries: Secondary | ICD-10-CM | POA: Diagnosis not present

## 2023-06-02 DIAGNOSIS — E1169 Type 2 diabetes mellitus with other specified complication: Secondary | ICD-10-CM

## 2023-06-02 DIAGNOSIS — Z1231 Encounter for screening mammogram for malignant neoplasm of breast: Secondary | ICD-10-CM

## 2023-06-02 NOTE — Assessment & Plan Note (Signed)
She does have a nodule in the left side of her neck.  Her PTH levels have been elevated.  Ultrasound ordered to further evaluate nodule in the neck to be sure she does not have a parathyroid adenoma.

## 2023-06-02 NOTE — Assessment & Plan Note (Signed)
Her diabetes is not well-controlled on recent lab testing.  Discussed working on dietary changes.  We may need to change her medications if this remains elevated.  I let her know that I can manage this rather than having her continue to see endocrinology.

## 2023-06-02 NOTE — Progress Notes (Signed)
Carla Estrada - 72 y.o. female MRN [id number]  Date of birth: 12/31/1950  Subjective Chief Complaint  Patient presents with   Establish Care    HPI Carla Estrada is a 72 y.o. female here today for initial visit to establish care.  She has a history of type 2 diabetes.  This is currently managed by endocrinology.  Current treatment is with glipizide 5 mg daily.  Her last A1c was 8.4%.  Earlier this year she was at 6.9%.  She would like primary care to take this over.  She is prescribed atorvastatin for management of associated hyperlipidemia.  She is also seeing neurology.  They are managing medications for restless legs and tremor.  Currently treated with primidone for essential tremor.  RLS is managed with Mirapex.  She is unsure if she has had a ferritin level checked recently.  She does have history of iron deficiency.  She was concerned about carotid artery disease.  She had a x-ray of her neck recently and carotid calcifications were noted.  Recommend that she have a follow-up ultrasound.  Her previous provider was in [place] however she would prefer to have this locally.  Additionally, she has had a nodule on the left side of her neck that has been present for a few years.  She feels like this has gotten larger.  She has noticed some hoarseness especially when singing or talking for long periods of time.  She denies pain in this area.  She did have thyroid function testing recently which was normal.  She has had elevated PTH with normal calcium levels.  She is due for colon cancer screening.  She does have history of colon polyps and has been on a every 2 to 3-year screening protocol.  She is also due for mammogram and bone density testing.   ROS:  A comprehensive ROS was completed and negative except as noted per HPI    Allergies  Allergen Reactions   Oxycodone-Acetaminophen Nausea And Vomiting    Other reaction(s): Nausea And Vomiting    Liraglutide Nausea And  Vomiting    Other reaction(s): Nausea And Vomiting    Other Nausea And Vomiting   Gabapentin Other (See Comments)    Pt states cant tolerate, tried for restless legs and started having double vision, mental status changes, out of body feeling   Exenatide Nausea Only and Nausea And Vomiting    Per outside record    Zoledronic Acid Nausea Only    Past Medical History:  Diagnosis Date   Allergy    Anemia    Asthma    Benign head tremor    Diabetes mellitus without complication (HCC)    GERD (gastroesophageal reflux disease)    History of kidney stones    Hypertension    Iron deficiency anemia 12/13/2017   Low vitamin B12 level    Neuromuscular disorder (HCC)    Restless legs    Thyroid disease    Vitamin D deficiency     Past Surgical History:  Procedure Laterality Date   ABDOMINAL HYSTERECTOMY     BREAST EXCISIONAL BIOPSY Left    cataract surgery  2020   CHOLECYSTECTOMY     COLONOSCOPY N/A 03/17/2021   Procedure: COLONOSCOPY;  Surgeon: Pasty Spillers, MD;  Location: [facility] ENDOSCOPY;  Service: Endoscopy;  Laterality: N/A;   COLONOSCOPY WITH PROPOFOL N/A 01/05/2018   Procedure: COLONOSCOPY WITH PROPOFOL;  Surgeon: Wyline Mood, MD;  Location: [facility] ENDOSCOPY;  Service: Gastroenterology;  Laterality: N/A;  ESOPHAGOGASTRODUODENOSCOPY (EGD) WITH PROPOFOL N/A 01/05/2018   Procedure: ESOPHAGOGASTRODUODENOSCOPY (EGD) WITH PROPOFOL;  Surgeon: Wyline Mood, MD;  Location: [facility] ENDOSCOPY;  Service: Gastroenterology;  Laterality: N/A;   FOOT SURGERY     FOOT SURGERY     GASTRIC BYPASS  2014    Social History   Socioeconomic History   Marital status: Married    Spouse name: Not on file   Number of children: Not on file   Years of education: Not on file   Highest education level: Not on file  Occupational History   Not on file  Tobacco Use   Smoking status: Former    Current packs/day: 0.25    Average packs/day: 0.3 packs/day for 20.0 years (5.0 ttl pk-yrs)    Types:  Cigarettes   Smokeless tobacco: Never  Vaping Use   Vaping status: Every Day   Start date: 10/11/2016  Substance and Sexual Activity   Alcohol use: No   Drug use: No   Sexual activity: Yes  Other Topics Concern   Not on file  Social History Narrative   Not on file   Social Determinants of Health   Financial Resource Strain: Low Risk  (05/25/2023)   Received from [facility]   Overall Financial Resource Strain (CARDIA)    Difficulty of Paying Living Expenses: Not hard at all  Food Insecurity: No Food Insecurity (05/25/2023)   Received from [facility]   Hunger Vital Sign    Worried About Running Out of Food in the Last Year: Never true    Ran Out of Food in the Last Year: Never true  Transportation Needs: No Transportation Needs (05/25/2023)   Received from [facility] - Transportation    In the past 12 months, has lack of transportation kept you from medical appointments or from getting medications?: No    Lack of Transportation (Non-Medical): No  Physical Activity: Unknown (07/21/2021)   [facility]   Exercise Vital Sign    Days of Exercise per Week: Patient declined    Minutes of Exercise per Session: Not on file  Stress: No Stress Concern Present (07/21/2021)   Received from [facility] - Occupational Stress Questionnaire    Feeling of Stress : Not at all  Social Connections: Unknown (02/23/2022)   Received from [facility]   Social Network    Social Network: Not on file    Family History  Problem Relation Age of Onset   Diabetes Mother    Heart attack Father    Heart attack Brother    Breast cancer Paternal Aunt     Health Maintenance  Topic Date Due   Medicare Annual Wellness (AWV)  Never done   COVID-19 Vaccine (1) Never done   FOOT EXAM  Never done   OPHTHALMOLOGY EXAM  Never done    Diabetic kidney evaluation - Urine ACR  Never done   Hepatitis C Screening  Never done   Diabetic kidney evaluation - eGFR measurement  12/14/2018   MAMMOGRAM  02/27/2021   Colonoscopy  03/17/2022   INFLUENZA VACCINE  01/09/2024 (Originally 05/12/2023)   DTaP/Tdap/Td (4 - Td or Tdap) 11/01/2023   HEMOGLOBIN A1C  11/25/2023   Pneumonia Vaccine 28+ Years old (3 of 3 - PPSV23 or PCV20) 08/28/2024   DEXA SCAN  Completed   Zoster Vaccines- Shingrix  Completed   HPV VACCINES  Aged Out     ----------------------------------------------------------------------------------------------------------------------------------------------------------------------------------------------------------------- Physical Exam BP (!) 143/84   Pulse 82   Ht 5\' 2"  (1.575 m)   Wt 175 lb (79.4 kg)   SpO2 99%   BMI 32.01 kg/m   Physical Exam Constitutional:      Appearance: Normal appearance.  HENT:     Head: Normocephalic and atraumatic.  Eyes:     General: No scleral icterus. Neck:     Comments: Small nodule located on the left side of the neck. Cardiovascular:     Rate and Rhythm: Normal rate and regular rhythm.  Pulmonary:     Effort: Pulmonary effort is normal.     Breath sounds: Normal breath sounds.  Musculoskeletal:     Cervical back: Neck supple.  Neurological:     Mental Status: She is alert.  Psychiatric:        Mood and Affect: Mood normal.        Behavior: Behavior normal.     ------------------------------------------------------------------------------------------------------------------------------------------------------------------------------------------------------------------- Assessment and Plan  Cobalamin deficiency Update B12 levels.  Diabetes mellitus, type II (HCC) Her diabetes is not well-controlled on recent lab testing.  Discussed working on dietary changes.  We may need to change her medications if this remains elevated.  I let her know that I can manage this  rather than having her continue to see endocrinology.  Essential tremor She has been seeing neurology for management.  I let her know that I was comfortable with managing her primidone as well as Mirapex.  Hyperlipidemia associated with type 2 diabetes mellitus (HCC) Tolerating atorvastatin well at current strength.  We may need to be a little more aggressive with her cholesterol if her ultrasound shows significant carotid artery disease.  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  Her PTH levels have been elevated.  Ultrasound ordered to further evaluate nodule in the neck to be sure she does not have a parathyroid adenoma.  Calcification of both carotid arteries Re-ordered carotid ultrasound to be completed at our imaging center.   No orders of the defined types were placed in this encounter.   No follow-ups on file.    This visit occurred during the SARS-CoV-2 public health emergency.  Safety protocols were in place, including screening questions prior to the visit, additional usage of staff PPE, and extensive cleaning of exam room while observing appropriate contact time as indicated for disinfecting solutions.

## 2023-06-02 NOTE — Patient Instructions (Signed)
Nice to meet you! We'll be in touch with lab and imaging results.

## 2023-06-02 NOTE — Assessment & Plan Note (Signed)
Re-ordered carotid ultrasound to be completed at our imaging center.

## 2023-06-02 NOTE — Assessment & Plan Note (Signed)
She has been seeing neurology for management.  I let her know that I was comfortable with managing her primidone as well as Mirapex.

## 2023-06-02 NOTE — Assessment & Plan Note (Signed)
Update B12 levels.  

## 2023-06-02 NOTE — Assessment & Plan Note (Signed)
Tolerating atorvastatin well at current strength.  We may need to be a little more aggressive with her cholesterol if her ultrasound shows significant carotid artery disease.

## 2023-06-03 LAB — IRON,TIBC AND FERRITIN PANEL
Ferritin: 20 ng/mL (ref 15–150)
Iron Saturation: 19 % (ref 15–55)
Iron: 80 ug/dL (ref 27–139)
Total Iron Binding Capacity: 424 ug/dL (ref 250–450)
UIBC: 344 ug/dL (ref 118–369)

## 2023-06-03 LAB — VITAMIN B12: Vitamin B-12: 525 pg/mL (ref 232–1245)

## 2023-06-07 ENCOUNTER — Encounter: Payer: Self-pay | Admitting: Family Medicine

## 2023-06-07 DIAGNOSIS — E785 Hyperlipidemia, unspecified: Secondary | ICD-10-CM | POA: Insufficient documentation

## 2023-07-01 ENCOUNTER — Encounter: Payer: Self-pay | Admitting: Family Medicine

## 2023-07-04 MED ORDER — PRIMIDONE 50 MG PO TABS: 100.0000 mg | ORAL_TABLET | Freq: Two times a day (BID) | ORAL | 1 refills | Status: DC

## 2023-07-06 ENCOUNTER — Encounter: Payer: Self-pay | Admitting: Oncology

## 2023-07-13 ENCOUNTER — Ambulatory Visit: Payer: PRIVATE HEALTH INSURANCE

## 2023-07-14 ENCOUNTER — Telehealth: Payer: Self-pay | Admitting: Gastroenterology

## 2023-07-14 NOTE — Telephone Encounter (Signed)
Good afternoon Dr. Tomasa Rand  The following patient is being referred to [place] for a colonoscopy. She had on last year with [facility]. She has also seen [facility]. She has now relocated to [facility] and would like to start her continued care with our facility. Records are available in Epic. Please review and advise of scheduling. Thank you.

## 2023-07-26 ENCOUNTER — Encounter: Payer: Self-pay | Admitting: Gastroenterology

## 2023-07-26 NOTE — Telephone Encounter (Signed)
Left VM for patient to call and schedule colonoscopy

## 2023-08-01 ENCOUNTER — Encounter: Payer: Self-pay | Admitting: Oncology

## 2023-08-16 ENCOUNTER — Telehealth: Payer: Self-pay

## 2023-08-16 NOTE — Telephone Encounter (Signed)
Called patient at 10:00 and 10:15.  Left message for her to call back by 5pm today to reschedule pre visit with nurse or the colonoscopy would be cancelled.

## 2023-08-16 NOTE — Telephone Encounter (Signed)
Patient rescheduled colonoscopy and pre visit for December.

## 2023-08-24 ENCOUNTER — Ambulatory Visit: Payer: PRIVATE HEALTH INSURANCE

## 2023-08-24 ENCOUNTER — Other Ambulatory Visit: Payer: PRIVATE HEALTH INSURANCE

## 2023-09-06 ENCOUNTER — Encounter: Payer: PRIVATE HEALTH INSURANCE | Admitting: Gastroenterology

## 2023-09-14 ENCOUNTER — Ambulatory Visit (AMBULATORY_SURGERY_CENTER): Payer: Medicare Other

## 2023-09-14 VITALS — Ht 62.0 in | Wt 172.0 lb

## 2023-09-14 DIAGNOSIS — Z860100 Personal history of colon polyps, unspecified: Secondary | ICD-10-CM

## 2023-09-14 MED ORDER — NA SULFATE-K SULFATE-MG SULF 17.5-3.13-1.6 GM/177ML PO SOLN: 1.0000 | Freq: Once | ORAL | 0 refills | Status: AC

## 2023-09-14 NOTE — Progress Notes (Signed)

## 2023-09-22 ENCOUNTER — Encounter: Payer: Self-pay | Admitting: Family Medicine

## 2023-09-22 ENCOUNTER — Ambulatory Visit (INDEPENDENT_AMBULATORY_CARE_PROVIDER_SITE_OTHER): Payer: Medicare Other | Admitting: Family Medicine

## 2023-09-22 VITALS — BP 166/74 | HR 73 | Resp 14 | Ht 62.0 in | Wt 181.3 lb

## 2023-09-22 DIAGNOSIS — E119 Type 2 diabetes mellitus without complications: Secondary | ICD-10-CM | POA: Diagnosis not present

## 2023-09-22 DIAGNOSIS — R519 Headache, unspecified: Secondary | ICD-10-CM | POA: Diagnosis not present

## 2023-09-22 DIAGNOSIS — I10 Essential (primary) hypertension: Secondary | ICD-10-CM | POA: Diagnosis not present

## 2023-09-22 LAB — POCT GLYCOSYLATED HEMOGLOBIN (HGB A1C): Hemoglobin A1C: 7.5 % — AB (ref 4.0–5.6)

## 2023-09-22 MED ORDER — PREDNISONE 20 MG PO TABS: 40.0000 mg | ORAL_TABLET | Freq: Every day | ORAL | 0 refills | Status: DC

## 2023-09-22 MED ORDER — LOSARTAN POTASSIUM 100 MG PO TABS: 100.0000 mg | ORAL_TABLET | Freq: Every day | ORAL | 1 refills | Status: DC

## 2023-09-22 NOTE — Assessment & Plan Note (Signed)
Controlled.  Will increase losartan to 100 mg.  Follow-up in 2 weeks for repeat blood pressure check.  I am not sure how much of the headaches could be triggering the blood pressure issue or the blood pressure could be triggering the headaches.  Her blood pressure was not well-controlled back in October.

## 2023-09-22 NOTE — Patient Instructions (Signed)
I am increasing your losartan from 50 mg to 100 mg.  Sent new prescription to the pharmacy to see if we can get your blood pressure down.  Make sure you are avoiding anything salty or adding salt.  Try to stick to less than 2000 mg/day.

## 2023-09-22 NOTE — Progress Notes (Signed)
Established Patient Office Visit  Subjective  Patient ID: Carla Estrada, female    DOB: 1951/08/09  Age: 72 y.o. MRN: [id number]  Chief Complaint  Patient presents with   Headache    HPI  She reports for the last month she has been having frequent headaches over both temples.  She can take 4 ibuprofen and get relief for maybe an hour but then it starts to gradually come back.  She denies any recent changes to medications or diet.  No recent caffeine use.  She is not currently taking any decongestants.  No recent cold symptoms that started around the time the symptoms started.  At times she feels that some is like a building pressure from the back of her head like her head might explode.    ROS    Objective:     BP (!) 166/74 (BP Location: Left Arm)   Pulse 73   Resp 14   Ht 5\' 2"  (1.575 m)   Wt 181 lb 4.8 oz (82.2 kg)   SpO2 100%   BMI 33.16 kg/m    Physical Exam Vitals and nursing note reviewed.  Constitutional:      Appearance: Normal appearance.  HENT:     Head: Normocephalic and atraumatic.  Eyes:     Conjunctiva/sclera: Conjunctivae normal.  Cardiovascular:     Rate and Rhythm: Normal rate and regular rhythm.  Pulmonary:     Effort: Pulmonary effort is normal.     Breath sounds: Normal breath sounds.  Skin:    General: Skin is warm and dry.  Neurological:     Mental Status: She is alert.  Psychiatric:        Mood and Affect: Mood normal.      Results for orders placed or performed in visit on 09/22/23  CMP14+EGFR  Result Value Ref Range   Glucose 159 (H) 70 - 99 mg/dL   BUN 17 8 - 27 mg/dL   Creatinine, Ser 6.21 0.57 - 1.00 mg/dL   eGFR 93 >30 QM/VHQ/4.69   BUN/Creatinine Ratio 24 12 - 28   Sodium 142 134 - 144 mmol/L   Potassium 4.1 3.5 - 5.2 mmol/L   Chloride 109 (H) 96 - 106 mmol/L   CO2 18 (L) 20 - 29 mmol/L   Calcium 9.1 8.7 - 10.3 mg/dL   Total Protein 6.6 6.0 - 8.5 g/dL   Albumin 3.9 3.9 - 4.9 g/dL   Globulin, Total 2.7 1.5 - 4.5  g/dL   Bilirubin Total 0.2 0.0 - 1.2 mg/dL   Alkaline Phosphatase 108 44 - 121 IU/L   AST 20 0 - 40 IU/L   ALT 16 0 - 32 IU/L  TSH  Result Value Ref Range   TSH 1.740 0.450 - 4.500 uIU/mL  Sedimentation rate  Result Value Ref Range   Sed Rate 4 0 - 40 mm/hr  C-reactive protein  Result Value Ref Range   CRP <1 0 - 10 mg/L  POCT HgB A1C  Result Value Ref Range   Hemoglobin A1C 7.5 (A) 4.0 - 5.6 %   HbA1c POC (<> result, manual entry)     HbA1c, POC (prediabetic range)     HbA1c, POC (controlled diabetic range)        The ASCVD Risk score (Arnett DK, et al., 2019) failed to calculate for the following reasons:   Cannot find a previous HDL lab   Cannot find a previous total cholesterol lab    Assessment & Plan:  Problem List Items Addressed This Visit       Cardiovascular and Mediastinum   Hypertension   Controlled.  Will increase losartan to 100 mg.  Follow-up in 2 weeks for repeat blood pressure check.  I am not sure how much of the headaches could be triggering the blood pressure issue or the blood pressure could be triggering the headaches.  Her blood pressure was not well-controlled back in October.      Relevant Medications   losartan (COZAAR) 100 MG tablet   Other Relevant Orders   CMP14+EGFR (Completed)   TSH (Completed)   Sedimentation rate (Completed)   C-reactive protein (Completed)   Urine Microalbumin w/creat. ratio     Endocrine   Diabetes mellitus, type II (HCC) - Primary   A1C improved today. Discussed ultimate goal of A1C under 7. Continue to work on exercise and diet.   Lab Results  Component Value Date   HGBA1C 7.5 (A) 09/22/2023         Relevant Medications   losartan (COZAAR) 100 MG tablet   Other Relevant Orders   POCT HgB A1C (Completed)   CMP14+EGFR (Completed)   TSH (Completed)   Sedimentation rate (Completed)   C-reactive protein (Completed)   Urine Microalbumin w/creat. ratio     Other   Nonintractable episodic headache    Bilat temple HA consider cause of uncontrolled BP, less likely mediation side effect. Consider temporal arteritis. No vision change and has been going on for a month.  Will check Sed rate.  Start prednisone to see if provides relief.  Adjust BP meds. Consider imaging if not improving.       Relevant Orders   CMP14+EGFR (Completed)   TSH (Completed)   Sedimentation rate (Completed)   C-reactive protein (Completed)   Urine Microalbumin w/creat. ratio    Return in about 1 week (around 09/29/2023) for Nurse BP Check.    Nani Gasser, MD

## 2023-09-23 DIAGNOSIS — R519 Headache, unspecified: Secondary | ICD-10-CM | POA: Insufficient documentation

## 2023-09-23 LAB — CMP14+EGFR
ALT: 16 [IU]/L (ref 0–32)
AST: 20 [IU]/L (ref 0–40)
Albumin: 3.9 g/dL (ref 3.9–4.9)
Alkaline Phosphatase: 108 [IU]/L (ref 44–121)
BUN/Creatinine Ratio: 24 (ref 12–28)
BUN: 17 mg/dL (ref 8–27)
Bilirubin Total: 0.2 mg/dL (ref 0.0–1.2)
CO2: 18 mmol/L — ABNORMAL LOW (ref 20–29)
Calcium: 9.1 mg/dL (ref 8.7–10.3)
Chloride: 109 mmol/L — ABNORMAL HIGH (ref 96–106)
Creatinine, Ser: 0.7 mg/dL (ref 0.57–1.00)
Globulin, Total: 2.7 g/dL (ref 1.5–4.5)
Glucose: 159 mg/dL — ABNORMAL HIGH (ref 70–99)
Potassium: 4.1 mmol/L (ref 3.5–5.2)
Sodium: 142 mmol/L (ref 134–144)
Total Protein: 6.6 g/dL (ref 6.0–8.5)
eGFR: 93 mL/min/{1.73_m2} (ref >59)

## 2023-09-23 LAB — C-REACTIVE PROTEIN: CRP: <1 mg/L (ref 0–10)

## 2023-09-23 LAB — SEDIMENTATION RATE: Sed Rate: 4 mm/h (ref 0–40)

## 2023-09-23 LAB — TSH: TSH: 1.74 u[IU]/mL (ref 0.450–4.500)

## 2023-09-23 NOTE — Progress Notes (Signed)
Labs look good so far. No acute inflammation. Hopefully BP will improve over the weekend and hopefully the steroid will help improve your headache as well.  If you are not feeling at least somewhat better by Monday then please give [place] a call back.  Make sure to follow-up in 1 to 2 weeks for that blood pressure so that we can get it all the way down.

## 2023-09-23 NOTE — Assessment & Plan Note (Signed)
Bilat temple HA consider cause of uncontrolled BP, less likely mediation side effect. Consider temporal arteritis. No vision change and has been going on for a month.  Will check Sed rate.  Start prednisone to see if provides relief.  Adjust BP meds. Consider imaging if not improving.

## 2023-09-23 NOTE — Assessment & Plan Note (Signed)
A1C improved today. Discussed ultimate goal of A1C under 7. Continue to work on exercise and diet.   Lab Results  Component Value Date   HGBA1C 7.5 (A) 09/22/2023

## 2023-09-28 ENCOUNTER — Encounter: Payer: Self-pay | Admitting: Gastroenterology

## 2023-09-28 ENCOUNTER — Ambulatory Visit: Payer: Medicare Other | Admitting: Gastroenterology

## 2023-09-28 VITALS — BP 156/69 | HR 60 | Temp 97.2°F | Resp 10 | Ht 62.0 in | Wt 172.0 lb

## 2023-09-28 DIAGNOSIS — Z860102 Personal history of hyperplastic colon polyps: Secondary | ICD-10-CM | POA: Diagnosis not present

## 2023-09-28 DIAGNOSIS — D122 Benign neoplasm of ascending colon: Secondary | ICD-10-CM

## 2023-09-28 DIAGNOSIS — K635 Polyp of colon: Secondary | ICD-10-CM

## 2023-09-28 DIAGNOSIS — D123 Benign neoplasm of transverse colon: Secondary | ICD-10-CM

## 2023-09-28 DIAGNOSIS — Z1211 Encounter for screening for malignant neoplasm of colon: Secondary | ICD-10-CM

## 2023-09-28 DIAGNOSIS — Z860100 Personal history of colon polyps, unspecified: Secondary | ICD-10-CM

## 2023-09-28 MED ORDER — SODIUM CHLORIDE 0.9 % IV SOLN: 500.0000 mL | INTRAVENOUS | Status: AC

## 2023-09-28 NOTE — Patient Instructions (Addendum)
Recommendation:  - Patient has a contact number available for emergencies. The signs and symptoms of potential delayed complications were discussed with the patient. Return to normal activities tomorrow. Written discharge instructions were provided to the patient.  - Resume previous diet.  - Continue present medications.  - Await pathology results.  - Repeat colonoscopy ( date not yet determined) for surveillance based on pathology results.   YOU HAD AN ENDOSCOPIC PROCEDURE TODAY AT THE [facility]:   Refer to the procedure report that was given to you for any specific questions about what was found during the examination.  If the procedure report does not answer your questions, please call your gastroenterologist to clarify.  If you requested that your care partner not be given the details of your procedure findings, then the procedure report has been included in a sealed envelope for you to review at your convenience later.  YOU SHOULD EXPECT: Some feelings of bloating in the abdomen. Passage of more gas than usual.  Walking can help get rid of the air that was put into your GI tract during the procedure and reduce the bloating. If you had a lower endoscopy (such as a colonoscopy or flexible sigmoidoscopy) you may notice spotting of blood in your stool or on the toilet paper. If you underwent a bowel prep for your procedure, you may not have a normal bowel movement for a few days.  Please Note:  You might notice some irritation and congestion in your nose or some drainage.  This is from the oxygen used during your procedure.  There is no need for concern and it should clear up in a day or so.  SYMPTOMS TO REPORT IMMEDIATELY:  Following lower endoscopy (colonoscopy or flexible sigmoidoscopy):  Excessive amounts of blood in the stool  Significant tenderness or worsening of abdominal pains  Swelling of the abdomen that is new, acute  Fever of 100F or higher   For urgent or  emergent issues, a gastroenterologist can be reached at any hour by calling [phone]. Do not use MyChart messaging for urgent concerns.    DIET:  We do recommend a small meal at first, but then you may proceed to your regular diet.  Drink plenty of fluids but you should avoid alcoholic beverages for 24 hours.  MEDICATIONS: Continue present medications.  Please see handouts given to you by your recovery nurse: Polyps.  FOLLOW UP: Await pathology results. Repeat colonoscopy (date not yet determined) for surveillance based on pathology results.  Thank you for allowing [place] to provide for your healthcare needs today.  ACTIVITY:  You should plan to take it easy for the rest of today and you should NOT DRIVE or use heavy machinery until tomorrow (because of the sedation medicines used during the test).    FOLLOW UP: Our staff will call the number listed on your records the next business day following your procedure.  We will call around 7:15- 8:00 am to check on you and address any questions or concerns that you may have regarding the information given to you following your procedure. If we do not reach you, we will leave a message.     If any biopsies were taken you will be contacted by phone or by letter within the next 1-3 weeks.  Please call us at [phone] if you have not heard about the biopsies in 3 weeks.    SIGNATURES/CONFIDENTIALITY: You and/or your care partner have signed paperwork which will be entered into  your electronic medical record.  These signatures attest to the fact that that the information above on your After Visit Summary has been reviewed and is understood.  Full responsibility of the confidentiality of this discharge information lies with you and/or your care-partner.

## 2023-09-28 NOTE — Progress Notes (Signed)
Pt's states no medical or surgical changes since previsit or office visit. 

## 2023-09-28 NOTE — Progress Notes (Signed)
Sedate, gd SR, tolerated procedure well, VSS, report to RN 

## 2023-09-28 NOTE — Op Note (Signed)
[facility] Patient Name: Carla Estrada Procedure Date: 09/28/2023 1:20 PM MRN: [id number] Endoscopist: Lorin Picket E. Tomasa Rand , MD, [id number] Age: 72 Referring MD:  Date of Birth: 1951-05-10 Gender: Female Account #: [id number] Procedure:                Colonoscopy Indications:              Surveillance: Personal history of colonic polyps                            with unknown histology on last colonoscopy                            (inadequate bowel preparation) less than 3 years ago Medicines:                Monitored Anesthesia Care Procedure:                Pre-Anesthesia Assessment:                           - Prior to the procedure, a History and Physical                            was performed, and patient medications and                            allergies were reviewed. The patient's tolerance of                            previous anesthesia was also reviewed. The risks                            and benefits of the procedure and the sedation                            options and risks were discussed with the patient.                            All questions were answered, and informed consent                            was obtained. Prior Anticoagulants: The patient has                            taken no anticoagulant or antiplatelet agents. ASA                            Grade Assessment: II - A patient with mild systemic                            disease. After reviewing the risks and benefits,                            the patient was deemed in satisfactory condition to  undergo the procedure.                           After obtaining informed consent, the colonoscope                            was passed under direct vision. Throughout the                            procedure, the patient's blood pressure, pulse, and                            oxygen saturations were monitored continuously. The                            Olympus  Scope [id number] was introduced through the                            anus and advanced to the the terminal ileum, with                            identification of the appendiceal orifice and IC                            valve. The colonoscopy was somewhat difficult due                            to restricted mobility of the colon. Successful                            completion of the procedure was aided by using                            manual pressure. The patient tolerated the                            procedure well. The quality of the bowel                            preparation was adequate. The terminal ileum,                            ileocecal valve, appendiceal orifice, and rectum                            were photographed. The bowel preparation used was                            SUPREP via extended prep with split dose                            instruction. Scope In: 1:26:43 PM Scope Out: 1:50:33 PM Scope Withdrawal Time: 0 hours 14 minutes 35 seconds  Total Procedure Duration: 0 hours 23 minutes 50 seconds  Findings:                 The perianal and digital rectal examinations were                            normal. Pertinent negatives include normal                            sphincter tone and no palpable rectal lesions.                           A 7 mm polyp was found in the ascending colon. The                            polyp was sessile. The polyp was removed with a                            cold snare. Resection and retrieval were complete.                            Estimated blood loss was minimal.                           A 3 mm polyp was found in the transverse colon. The                            polyp was sessile. The polyp was removed with a                            cold snare. Resection and retrieval were complete.                            Estimated blood loss was minimal.                           A 3 mm polyp was found in the splenic  flexure. The                            polyp was sessile. The polyp was removed with a                            cold snare. Resection and retrieval were complete.                            Estimated blood loss was minimal.                           The exam was otherwise normal throughout the                            examined colon.                           The terminal ileum appeared  normal.                           The retroflexed view of the distal rectum and anal                            verge was normal and showed no anal or rectal                            abnormalities. Complications:            No immediate complications. Estimated Blood Loss:     Estimated blood loss was minimal. Impression:               - One 7 mm polyp in the ascending colon, removed                            with a cold snare. Resected and retrieved.                           - One 3 mm polyp in the transverse colon, removed                            with a cold snare. Resected and retrieved.                           - One 3 mm polyp at the splenic flexure, removed                            with a cold snare. Resected and retrieved.                           - The examined portion of the ileum was normal.                           - The distal rectum and anal verge are normal on                            retroflexion view. Recommendation:           - Patient has a contact number available for                            emergencies. The signs and symptoms of potential                            delayed complications were discussed with the                            patient. Return to normal activities tomorrow.                            Written discharge instructions were provided to the  patient.                           - Resume previous diet.                           - Continue present medications.                           - Await pathology results.                            - Repeat colonoscopy (date not yet determined) for                            surveillance based on pathology results. Reyansh Kushnir E. Tomasa Rand, MD 09/28/2023 1:57:20 PM This report has been signed electronically.

## 2023-09-29 ENCOUNTER — Ambulatory Visit: Payer: Medicare Other

## 2023-09-29 ENCOUNTER — Ambulatory Visit (INDEPENDENT_AMBULATORY_CARE_PROVIDER_SITE_OTHER): Payer: Medicare Other

## 2023-09-29 ENCOUNTER — Telehealth: Payer: Self-pay

## 2023-09-29 VITALS — BP 139/63 | HR 90 | Ht 62.0 in

## 2023-09-29 DIAGNOSIS — Z23 Encounter for immunization: Secondary | ICD-10-CM | POA: Diagnosis not present

## 2023-09-29 DIAGNOSIS — I10 Essential (primary) hypertension: Secondary | ICD-10-CM

## 2023-09-29 NOTE — Progress Notes (Signed)
   Established Patient Office Visit  Subjective   Patient ID: Carla Estrada, female    DOB: March 23, 1951  Age: 72 y.o. MRN: [id number]  Chief Complaint  Patient presents with   Hypertension    BP check nurse visit     HPI  BP check nurse visit.  Patient denies chest pain, shortness of breath, palpitations, dizziness or medication problems. Patient does c/o headaches that come and go frequently but is slightly better today.   ROS    Objective:     BP (!) 145/65 (BP Location: Left Arm, Patient Position: Sitting, Cuff Size: Large)   Pulse 90   Ht 5\' 2"  (1.575 m)   SpO2 100%   BMI 31.46 kg/m    Physical Exam   No results found for any visits on 09/29/23.    The ASCVD Risk score (Arnett DK, et al., 2019) failed to calculate for the following reasons:   Cannot find a previous HDL lab   Cannot find a previous total cholesterol lab    Assessment & Plan:  BP check nurse visit- initial reading 145/65 and second reading 139/63. Per Dr. Ashley Royalty continue Losartan 100mg  -return in 3 months for HTN follow up visit and sooner appt if headaches continue.  Problem List Items Addressed This Visit   None   No follow-ups on file.    Elizabeth Palau, LPN

## 2023-09-29 NOTE — Telephone Encounter (Signed)
  Follow up Call-     09/28/2023    1:01 PM  Call back number  Post procedure Call Back phone  # [phone]  Permission to leave phone message Yes     Patient questions:  Do you have a fever, pain , or abdominal swelling? No. Pain Score  0 *  Have you tolerated food without any problems? Yes.    Have you been able to return to your normal activities? Yes.    Do you have any questions about your discharge instructions: Diet   No. Medications  No. Follow up visit  No.  Do you have questions or concerns about your Care? No.  Actions: * If pain score is 4 or above: No action needed, pain <4.

## 2023-09-29 NOTE — Patient Instructions (Signed)
continue Losartan 100mg  -return in 3 months for HTN follow up visit and sooner appt if headaches continue.

## 2023-10-03 LAB — SURGICAL PATHOLOGY

## 2023-10-04 NOTE — Progress Notes (Signed)
[facility] History and Physical   Primary Care Physician:  Everrett Coombe, DO   Reason for Procedure:   History of colon polyps  Plan:    Colonoscopy     HPI: Carla Estrada is a 72 y.o. female undergoing surveillance colonoscopy.  She has no family history of colon cancer and no chronic GI symptoms. She had a colonoscopy in 2022 in which a small sigmoid hyperplastic polyp was removed but her bowel prep was reported as fair and she was recommended to repeat in 1 year   Past Medical History:  Diagnosis Date   Allergy    Anemia    Benign head tremor    Cataract    Diabetes mellitus without complication (HCC)    GERD (gastroesophageal reflux disease)    History of kidney stones    Hypertension    Iron deficiency anemia 12/13/2017   Low vitamin B12 level    Neuromuscular disorder (HCC)    Restless legs    Thyroid disease    Vitamin D deficiency     Past Surgical History:  Procedure Laterality Date   ABDOMINAL HYSTERECTOMY     BREAST EXCISIONAL BIOPSY Left    cataract surgery  2020   CHOLECYSTECTOMY     COLONOSCOPY N/A 03/17/2021   Procedure: COLONOSCOPY;  Surgeon: Pasty Spillers, MD;  Location: [facility] ENDOSCOPY;  Service: Endoscopy;  Laterality: N/A;   COLONOSCOPY WITH PROPOFOL N/A 01/05/2018   Procedure: COLONOSCOPY WITH PROPOFOL;  Surgeon: Wyline Mood, MD;  Location: [facility] ENDOSCOPY;  Service: Gastroenterology;  Laterality: N/A;   ESOPHAGOGASTRODUODENOSCOPY (EGD) WITH PROPOFOL N/A 01/05/2018   Procedure: ESOPHAGOGASTRODUODENOSCOPY (EGD) WITH PROPOFOL;  Surgeon: Wyline Mood, MD;  Location: [facility] ENDOSCOPY;  Service: Gastroenterology;  Laterality: N/A;   FOOT SURGERY     FOOT SURGERY     GASTRIC BYPASS  2014    Prior to Admission medications   Medication Sig Start Date End Date Taking? Authorizing Provider  atorvastatin (LIPITOR) 10 MG tablet Take by mouth. 08/29/19 09/28/23 Yes [provider]  betamethasone dipropionate 0.05 % cream Apply  topically 2 (two) times daily as needed.   Yes [provider]  glipiZIDE (GLUCOTROL XL) 10 MG 24 hr tablet Take 10 mg by mouth daily.   Yes [provider]  losartan (COZAAR) 100 MG tablet Take 1 tablet (100 mg total) by mouth daily. 09/22/23  Yes Agapito Games, MD  omeprazole (PRILOSEC) 20 MG capsule Take 20 mg by mouth as needed. 01/10/17  Yes [provider]  predniSONE (DELTASONE) 20 MG tablet Take 2 tablets (40 mg total) by mouth daily with breakfast. Patient not taking: Reported on 09/29/2023 09/22/23  Yes Agapito Games, MD  primidone (MYSOLINE) 50 MG tablet Take 2 tablets (100 mg total) by mouth in the morning and at bedtime. 07/04/23  Yes Everrett Coombe, DO  QUEtiapine (SEROQUEL) 25 MG tablet Take 25 mg by mouth at bedtime.   Yes [provider]  albuterol (VENTOLIN HFA) 108 (90 Base) MCG/ACT inhaler Inhale into the lungs. 12/06/17   [provider]  Cyanocobalamin (B-12 COMPLIANCE INJECTION IJ) Inject 1,000 mcg into the muscle every 30 (thirty) days. 11/10/16   [provider]  Eszopiclone 3 MG TABS Take 3 mg by mouth daily as needed. Patient not taking: Reported on 09/28/2023 10/05/17 11/16/19  [provider]  furosemide (LASIX) 40 MG tablet Take by mouth. 06/16/17   [provider]  promethazine (PHENERGAN) 25 MG tablet Take 25 mg by mouth as needed.  11/10/16   [provider]  propranolol (INDERAL) 10 MG tablet  11/06/19   [provider]  Vitamin D, Ergocalciferol, (DRISDOL) 1.25 MG (50000 UNIT) CAPS capsule Take 50,000 Units by mouth once a week.    [provider]    Current Outpatient Medications  Medication Sig Dispense Refill   atorvastatin (LIPITOR) 10 MG tablet Take by mouth.     betamethasone dipropionate 0.05 % cream Apply topically 2 (two) times daily as needed.     glipiZIDE (GLUCOTROL XL) 10 MG 24 hr tablet Take 10 mg by mouth daily.     losartan (COZAAR) 100 MG  tablet Take 1 tablet (100 mg total) by mouth daily. 90 tablet 1   omeprazole (PRILOSEC) 20 MG capsule Take 20 mg by mouth as needed.     predniSONE (DELTASONE) 20 MG tablet Take 2 tablets (40 mg total) by mouth daily with breakfast. (Patient not taking: Reported on 09/29/2023) 10 tablet 0   primidone (MYSOLINE) 50 MG tablet Take 2 tablets (100 mg total) by mouth in the morning and at bedtime. 360 tablet 1   QUEtiapine (SEROQUEL) 25 MG tablet Take 25 mg by mouth at bedtime.     albuterol (VENTOLIN HFA) 108 (90 Base) MCG/ACT inhaler Inhale into the lungs.     Cyanocobalamin (B-12 COMPLIANCE INJECTION IJ) Inject 1,000 mcg into the muscle every 30 (thirty) days.     Eszopiclone 3 MG TABS Take 3 mg by mouth daily as needed. (Patient not taking: Reported on 09/28/2023)     furosemide (LASIX) 40 MG tablet Take by mouth.     promethazine (PHENERGAN) 25 MG tablet Take 25 mg by mouth as needed.     propranolol (INDERAL) 10 MG tablet      Vitamin D, Ergocalciferol, (DRISDOL) 1.25 MG (50000 UNIT) CAPS capsule Take 50,000 Units by mouth once a week.     No current facility-administered medications for this visit.    Allergies as of 09/28/2023 - Review Complete 09/28/2023  Allergen Reaction Noted   Oxycodone-acetaminophen Nausea And Vomiting 02/01/2011   Liraglutide Nausea And Vomiting 02/01/2011   Other Nausea And Vomiting 02/01/2011   Gabapentin Other (See Comments) 09/09/2017   Inderal [propranolol] Other (See Comments) 09/28/2023   Propoxyphene Nausea Only 09/14/2023   Exenatide Nausea And Vomiting, Nausea Only, and Other (See Comments) 05/27/2014   Zoledronic acid Nausea Only 09/26/2014    Family History  Problem Relation Age of Onset   Diabetes Mother    Heart attack Father    Heart attack Brother    Breast cancer Paternal Aunt     Social History   Socioeconomic History   Marital status: Married    Spouse name: Not on file   Number of children: Not on file   Years of education: Not  on file   Highest education level: Not on file  Occupational History   Not on file  Tobacco Use   Smoking status: Former    Current packs/day: 0.25    Average packs/day: 0.3 packs/day for 20.0 years (5.0 ttl pk-yrs)    Types: Cigarettes   Smokeless tobacco: Never  Vaping Use   Vaping status: Every Day   Start date: 10/11/2016  Substance and Sexual Activity   Alcohol use: No   Drug use: No   Sexual activity: Yes  Other Topics Concern   Not on file  Social History Narrative   Not on file   Social Drivers of Health   Financial Resource Strain: Low Risk  (  05/25/2023)   Received from [facility]   Overall Financial Resource Strain (CARDIA)    Difficulty of Paying Living Expenses: Not hard at all  Food Insecurity: No Food Insecurity (05/25/2023)   Received from [facility]   Hunger Vital Sign    Worried About Running Out of Food in the Last Year: Never true    Ran Out of Food in the Last Year: Never true  Transportation Needs: No Transportation Needs (05/25/2023)   Received from [facility] - Transportation    In the past 12 months, has lack of transportation kept you from medical appointments or from getting medications?: No    Lack of Transportation (Non-Medical): No  Physical Activity: Unknown (07/21/2021)   Received from [facility], [facility]   Exercise Vital Sign    Days of Exercise per Week: Patient declined    Minutes of Exercise per Session: Not on file  Stress: No Stress Concern Present (07/21/2021)   Received from [facility], [facility] - Occupational Stress Questionnaire    Feeling of Stress : Not at all  Social Connections: Unknown (02/23/2022)   Received from [facility], [facility]   Social Network    Social Network: Not on file  Intimate Partner Violence: Unknown (01/15/2022)   Received from [facility], [facility]   HITS     Physically Hurt: Not on file    Insult or Talk Down To: Not on file    Threaten Physical Harm: Not on file    Scream or Curse: Not on file    Review of Systems:  All other review of systems negative except as mentioned in the HPI.  Physical Exam: Vital signs BP (!) 156/69   Pulse 60   Temp (!) 97.2 F (36.2 C)   Resp 10   Ht 5\' 2"  (1.575 m)   Wt 172 lb (78 kg)   SpO2 100%   BMI 31.46 kg/m   General:   Alert,  Well-developed, well-nourished, pleasant and cooperative in NAD Airway:  Mallampati 2 Lungs:  Clear throughout to auscultation.   Heart:  Regular rate and rhythm; no murmurs, clicks, rubs,  or gallops. Abdomen:  Soft, nontender and nondistended. Normal bowel sounds.   Neuro/Psych:  Normal mood and affect. A and O x 3   Carla Estrada E. Tomasa Rand, MD [facility]

## 2023-10-05 NOTE — Progress Notes (Signed)
Carla Estrada,  Two of the three polyps which I removed during your recent procedure were proven to be completely benign but are considered "pre-cancerous" polyps that MAY have grown into cancer if they had not been removed.  One polyp was not precancerous. Studies shows that at least 20% of women over age 70 and 30% of men over age 38 have pre-cancerous polyps.  Based on current nationally recognized surveillance guidelines, I recommend that you have a repeat colonoscopy in 7 years.   Because colon cancer screening after age 10 is done on a case-by-case basis, taking into account the patient's risk factors for colon cancer, as well as comorbidities and life expectancy, I would recommend you make an appointment with me in 7 years to discuss the risks/benefits of further colon cancer screening.   If you develop any new rectal bleeding, abdominal pain or significant bowel habit changes, please contact me before then.

## 2023-10-19 ENCOUNTER — Ambulatory Visit: Payer: Medicare Other

## 2023-10-19 DIAGNOSIS — Z1231 Encounter for screening mammogram for malignant neoplasm of breast: Secondary | ICD-10-CM

## 2023-10-19 DIAGNOSIS — Z13820 Encounter for screening for osteoporosis: Secondary | ICD-10-CM

## 2023-10-19 DIAGNOSIS — Z780 Asymptomatic menopausal state: Secondary | ICD-10-CM | POA: Diagnosis not present

## 2023-10-24 ENCOUNTER — Ambulatory Visit: Payer: Medicare Other | Admitting: Family Medicine

## 2023-10-25 ENCOUNTER — Encounter: Payer: Self-pay | Admitting: Family Medicine

## 2023-11-01 ENCOUNTER — Ambulatory Visit: Payer: Medicare Other | Admitting: Family Medicine

## 2023-11-03 ENCOUNTER — Encounter: Payer: Self-pay | Admitting: Family Medicine

## 2023-11-03 ENCOUNTER — Ambulatory Visit (INDEPENDENT_AMBULATORY_CARE_PROVIDER_SITE_OTHER): Payer: Medicare Other | Admitting: Family Medicine

## 2023-11-03 VITALS — BP 132/83 | HR 80 | Ht 62.0 in | Wt 180.0 lb

## 2023-11-03 DIAGNOSIS — R519 Headache, unspecified: Secondary | ICD-10-CM | POA: Diagnosis not present

## 2023-11-03 DIAGNOSIS — R4189 Other symptoms and signs involving cognitive functions and awareness: Secondary | ICD-10-CM

## 2023-11-03 MED ORDER — PREDNISONE 20 MG PO TABS: 20.0000 mg | ORAL_TABLET | Freq: Two times a day (BID) | ORAL | 0 refills | Status: AC

## 2023-11-03 NOTE — Progress Notes (Signed)
Carla Estrada - 73 y.o. female MRN [id number]  Date of birth: 1951-03-30  Subjective Chief Complaint  Patient presents with   Memory Loss    HPI Carla Estrada is a 73 y.o. female here today with concerns about increased headaches as well as memory changes.  She reports she has had increased frequency of headaches over the past several weeks.  She has had headaches in the past but not quite as often.  Steroid to help with these in the past.  Pain is in the bilateral temporal areas with radiation to the back of the head.  She denies vision changes, dizziness or nausea.   She is also noticed increased memory changes.  She feels that she is more forgetful of simple things that she feels that she should not be forgetful of.  She is unsure of her family history so does not know if there is any family history of dementia.  TSH and B12 levels were normal a few months ago.  She denies significant alcohol use  ROS:  A comprehensive ROS was completed and negative except as noted per HPI   Past Medical History:  Diagnosis Date   Allergy    Anemia    Benign head tremor    Cataract    Diabetes mellitus without complication (HCC)    GERD (gastroesophageal reflux disease)    History of kidney stones    Hypertension    Iron deficiency anemia 12/13/2017   Low vitamin B12 level    Neuromuscular disorder (HCC)    Restless legs    Thyroid disease    Vitamin D deficiency     Past Surgical History:  Procedure Laterality Date   ABDOMINAL HYSTERECTOMY     BREAST EXCISIONAL BIOPSY Left    cataract surgery  2020   CHOLECYSTECTOMY     COLONOSCOPY N/A 03/17/2021   Procedure: COLONOSCOPY;  Surgeon: Pasty Spillers, MD;  Location: [facility] ENDOSCOPY;  Service: Endoscopy;  Laterality: N/A;   COLONOSCOPY WITH PROPOFOL N/A 01/05/2018   Procedure: COLONOSCOPY WITH PROPOFOL;  Surgeon: Wyline Mood, MD;  Location: [facility] ENDOSCOPY;  Service: Gastroenterology;  Laterality: N/A;   ESOPHAGOGASTRODUODENOSCOPY  (EGD) WITH PROPOFOL N/A 01/05/2018   Procedure: ESOPHAGOGASTRODUODENOSCOPY (EGD) WITH PROPOFOL;  Surgeon: Wyline Mood, MD;  Location: [facility] ENDOSCOPY;  Service: Gastroenterology;  Laterality: N/A;   FOOT SURGERY     FOOT SURGERY     GASTRIC BYPASS  2014    Social History   Socioeconomic History   Marital status: Married    Spouse name: Not on file   Number of children: Not on file   Years of education: Not on file   Highest education level: Not on file  Occupational History   Not on file  Tobacco Use   Smoking status: Former    Current packs/day: 0.25    Average packs/day: 0.3 packs/day for 20.0 years (5.0 ttl pk-yrs)    Types: Cigarettes   Smokeless tobacco: Never  Vaping Use   Vaping status: Every Day   Start date: 10/11/2016  Substance and Sexual Activity   Alcohol use: No   Drug use: No   Sexual activity: Yes  Other Topics Concern   Not on file  Social History Narrative   Not on file   Social Drivers of Health   Financial Resource Strain: Low Risk  (05/25/2023)   Received from [facility]   Overall Financial Resource Strain (CARDIA)    Difficulty of Paying Living Expenses: Not hard at  all  Food Insecurity: No Food Insecurity (05/25/2023)   Received from [facility]   Hunger Vital Sign    Worried About Running Out of Food in the Last Year: Never true    Ran Out of Food in the Last Year: Never true  Transportation Needs: No Transportation Needs (05/25/2023)   Received from [facility] - Transportation    In the past 12 months, has lack of transportation kept you from medical appointments or from getting medications?: No    Lack of Transportation (Non-Medical): No  Physical Activity: Unknown (07/21/2021)   Received from [facility]   Exercise Vital Sign    Days of Exercise per Week: Patient declined    Minutes of Exercise per Session: Not on file  Stress: No Stress Concern Present  (07/21/2021)   Received from [facility] - Occupational Stress Questionnaire    Feeling of Stress : Not at all  Social Connections: Unknown (02/23/2022)   Received from [facility]   Social Network    Social Network: Not on file    Family History  Problem Relation Age of Onset   Diabetes Mother    Heart attack Father    Heart attack Brother    Breast cancer Paternal Aunt     Health Maintenance  Topic Date Due   Medicare Annual Wellness (AWV)  Never done   COVID-19 Vaccine (1) Never done   FOOT EXAM  Never done   Diabetic kidney evaluation - Urine ACR  Never done   Hepatitis C Screening  Never done   DTaP/Tdap/Td (4 - Td or Tdap) 11/01/2023   OPHTHALMOLOGY EXAM  01/12/2024 (Originally 08/31/1961)   HEMOGLOBIN A1C  03/22/2024   Diabetic kidney evaluation - eGFR measurement  09/21/2024   MAMMOGRAM  10/18/2025   Colonoscopy  09/27/2030   Pneumonia Vaccine 75+ Years old  Completed   INFLUENZA VACCINE  Completed   DEXA SCAN  Completed   Zoster Vaccines- Shingrix  Completed   HPV VACCINES  Aged Out     ----------------------------------------------------------------------------------------------------------------------------------------------------------------------------------------------------------------- Physical Exam BP 132/83 (BP Location: Left Arm, Patient Position: Sitting, Cuff Size: Large)   Pulse 80   Ht 5\' 2"  (1.575 m)   Wt 180 lb (81.6 kg)   SpO2 100%   BMI 32.92 kg/m   Physical Exam Constitutional:      Appearance: Normal appearance.  HENT:     Head: Normocephalic and atraumatic.  Eyes:     General: No scleral icterus. Cardiovascular:     Rate and Rhythm: Normal rate and regular rhythm.  Pulmonary:     Effort: Pulmonary effort is normal.     Breath sounds: Normal breath sounds.  Neurological:     General: No focal deficit present.     Mental Status: She is alert.   Psychiatric:        Mood and Affect: Mood normal.        Behavior: Behavior normal.     ------------------------------------------------------------------------------------------------------------------------------------------------------------------------------------------------------------------- Assessment and Plan  Cognitive change MRI of the brain ordered as she has had cognitive change as well as increased frequency of headaches.  Will place referral to neurology as well for further evaluation of her cognitive change.  Increased frequency of headaches Her headaches have responded pretty well to prednisone in the past and will add this on for short burst.   Meds ordered this encounter  Medications   predniSONE (  DELTASONE) 20 MG tablet    Sig: Take 1 tablet (20 mg total) by mouth 2 (two) times daily with a meal for 5 days.    Dispense:  10 tablet    Refill:  0    No follow-ups on file.    This visit occurred during the SARS-CoV-2 public health emergency.  Safety protocols were in place, including screening questions prior to the visit, additional usage of staff PPE, and extensive cleaning of exam room while observing appropriate contact time as indicated for disinfecting solutions.

## 2023-11-03 NOTE — Assessment & Plan Note (Signed)
MRI of the brain ordered as she has had cognitive change as well as increased frequency of headaches.  Will place referral to neurology as well for further evaluation of her cognitive change.

## 2023-11-03 NOTE — Progress Notes (Signed)
Patient and husband state she has memory issues. Questioning primidone causing the memory issue because her memory seems worse after taking med.

## 2023-11-03 NOTE — Patient Instructions (Addendum)
I have ordered an MRI and placed a referral to neurology.  You will be contacted to schedule these  Try prednisone for recurrent headaches.   You can try magnesium glycinate 200-400mg  nightly which may help with restlessness and headaches

## 2023-11-03 NOTE — Assessment & Plan Note (Signed)
Her headaches have responded pretty well to prednisone in the past and will add this on for short burst.

## 2023-11-05 ENCOUNTER — Ambulatory Visit: Payer: Medicare Other

## 2023-11-05 DIAGNOSIS — R519 Headache, unspecified: Secondary | ICD-10-CM

## 2023-11-05 DIAGNOSIS — R4189 Other symptoms and signs involving cognitive functions and awareness: Secondary | ICD-10-CM

## 2023-11-05 DIAGNOSIS — F0390 Unspecified dementia without behavioral disturbance: Secondary | ICD-10-CM

## 2023-11-10 ENCOUNTER — Encounter: Payer: Self-pay | Admitting: Family Medicine

## 2023-11-11 ENCOUNTER — Telehealth: Payer: Self-pay

## 2023-11-11 ENCOUNTER — Encounter: Payer: Self-pay | Admitting: Family Medicine

## 2023-11-11 NOTE — Telephone Encounter (Signed)
Called radiology reading room and asked for brain MRI to be priority read.

## 2023-11-11 NOTE — Telephone Encounter (Signed)
 Patient informed.

## 2023-11-11 NOTE — Telephone Encounter (Signed)
Would you like me to contact radiology reading room to have brain MRI results read STAT?

## 2023-11-11 NOTE — Telephone Encounter (Signed)
Copied from CRM [id number]. Topic: Clinical - Lab/Test Results >> Nov 10, 2023 11:50 AM Nila Nephew wrote: Reason for CRM: Patient calling and requesting to have results for MRI expedited if possible. Patient has an upcoming appointment with neurology and would like to have results prior to that appointment. Appointment is 02/14.

## 2023-12-15 ENCOUNTER — Ambulatory Visit

## 2023-12-15 ENCOUNTER — Ambulatory Visit (INDEPENDENT_AMBULATORY_CARE_PROVIDER_SITE_OTHER)

## 2023-12-15 ENCOUNTER — Encounter: Payer: Self-pay | Admitting: Family Medicine

## 2023-12-15 ENCOUNTER — Ambulatory Visit (INDEPENDENT_AMBULATORY_CARE_PROVIDER_SITE_OTHER): Payer: Medicare Other | Admitting: Family Medicine

## 2023-12-15 VITALS — BP 126/81 | HR 84 | Ht 62.0 in | Wt 173.0 lb

## 2023-12-15 DIAGNOSIS — M546 Pain in thoracic spine: Secondary | ICD-10-CM

## 2023-12-15 DIAGNOSIS — R109 Unspecified abdominal pain: Secondary | ICD-10-CM

## 2023-12-15 DIAGNOSIS — M545 Low back pain, unspecified: Secondary | ICD-10-CM | POA: Diagnosis not present

## 2023-12-15 DIAGNOSIS — M549 Dorsalgia, unspecified: Secondary | ICD-10-CM | POA: Diagnosis not present

## 2023-12-15 MED ORDER — PRIMIDONE 50 MG PO TABS: 100.0000 mg | ORAL_TABLET | Freq: Two times a day (BID) | ORAL | 1 refills | Status: DC

## 2023-12-15 MED ORDER — TIZANIDINE HCL 4 MG PO TABS: 4.0000 mg | ORAL_TABLET | Freq: Four times a day (QID) | ORAL | 1 refills | Status: DC | PRN

## 2023-12-15 NOTE — Patient Instructions (Signed)
 Try tizanidine (muscle relaxer) We'll be in touch with imaging results.

## 2023-12-15 NOTE — Assessment & Plan Note (Signed)
 She has some tightness over the L paraspinals.  I think this is more than likely MSK in origin, however, with some flank pain as well I am checking renal function, UA and have ordered renal US.  Imaging of the thoracic and lumbar spine ordered as well.  Adding trial of tizanidine.

## 2023-12-15 NOTE — Progress Notes (Signed)
 Carla Estrada - 73 y.o. female MRN [id number]  Date of birth: 07/05/1951  Subjective Chief Complaint  Patient presents with   Back Pain    HPI Carla Estrada is a 73 y.o. female here today with complaint of pain in her left mid and lower back as well as flank area.  This started about 2 months ago.  She denies radiation of pain.   She does have visible and palpable swelling around this area.   She is concerned about her kidneys.  She has not had urinary symptoms including dysuria, frequency or hematuria.  She denies fever or chills.  She has tried ibuprofen, stretching and heat without adequate relief.    ROS:  A comprehensive ROS was completed and negative except as noted per HPI  Allergies  Allergen Reactions   Oxycodone-Acetaminophen Nausea And Vomiting    Other reaction(s): Nausea And Vomiting    Liraglutide Nausea And Vomiting    Other reaction(s): Nausea And Vomiting    Other Nausea And Vomiting   Gabapentin Other (See Comments)    Pt states cant tolerate, tried for restless legs and started having double vision, mental status changes, out of body feeling   Inderal [Propranolol] Other (See Comments)    dizzy   Propoxyphene Nausea Only   Exenatide Nausea And Vomiting, Nausea Only and Other (See Comments)    Per outside record   Zoledronic Acid Nausea Only    Past Medical History:  Diagnosis Date   Allergy    Anemia    Benign head tremor    Cataract    Diabetes mellitus without complication (HCC)    GERD (gastroesophageal reflux disease)    History of kidney stones    Hypertension    Iron deficiency anemia 12/13/2017   Low vitamin B12 level    Neuromuscular disorder (HCC)    Restless legs    Thyroid disease    Vitamin D deficiency     Past Surgical History:  Procedure Laterality Date   ABDOMINAL HYSTERECTOMY     BREAST EXCISIONAL BIOPSY Left    cataract surgery  2020   CHOLECYSTECTOMY     COLONOSCOPY N/A 03/17/2021   Procedure: COLONOSCOPY;  Surgeon:  Pasty Spillers, MD;  Location: [facility] ENDOSCOPY;  Service: Endoscopy;  Laterality: N/A;   COLONOSCOPY WITH PROPOFOL N/A 01/05/2018   Procedure: COLONOSCOPY WITH PROPOFOL;  Surgeon: Wyline Mood, MD;  Location: [facility] ENDOSCOPY;  Service: Gastroenterology;  Laterality: N/A;   ESOPHAGOGASTRODUODENOSCOPY (EGD) WITH PROPOFOL N/A 01/05/2018   Procedure: ESOPHAGOGASTRODUODENOSCOPY (EGD) WITH PROPOFOL;  Surgeon: Wyline Mood, MD;  Location: [facility] ENDOSCOPY;  Service: Gastroenterology;  Laterality: N/A;   FOOT SURGERY     FOOT SURGERY     GASTRIC BYPASS  2014    Social History   Socioeconomic History   Marital status: Married    Spouse name: Not on file   Number of children: Not on file   Years of education: Not on file   Highest education level: Not on file  Occupational History   Not on file  Tobacco Use   Smoking status: Former    Current packs/day: 0.25    Average packs/day: 0.3 packs/day for 20.0 years (5.0 ttl pk-yrs)    Types: Cigarettes   Smokeless tobacco: Never  Vaping Use   Vaping status: Every Day   Start date: 10/11/2016  Substance and Sexual Activity   Alcohol use: No   Drug use: No   Sexual activity: Yes  Other Topics Concern  Not on file  Social History Narrative   Not on file   Social Drivers of Health   Financial Resource Strain: Low Risk  (12/01/2023)   Received from [facility]   Overall Financial Resource Strain (CARDIA)    Difficulty of Paying Living Expenses: Not very hard  Food Insecurity: No Food Insecurity (12/01/2023)   Received from [facility]   Hunger Vital Sign    Worried About Running Out of Food in the Last Year: Never true    Ran Out of Food in the Last Year: Never true  Transportation Needs: No Transportation Needs (12/01/2023)   Received from [facility] - Transportation    Lack of Transportation (Medical): No    Lack of Transportation (Non-Medical): No  Physical Activity: Unknown (12/01/2023)   Received from [facility]    Exercise Vital Sign    Days of Exercise per Week: 0 days    Minutes of Exercise per Session: Not on file  Stress: No Stress Concern Present (12/01/2023)   Received from [facility] of Occupational Health - Occupational Stress Questionnaire    Feeling of Stress : Only a little  Social Connections: Moderately Integrated (12/01/2023)   Received from [facility]   Social Network    How would you rate your social network (family, work, friends)?: Adequate participation with social networks    Family History  Problem Relation Age of Onset   Diabetes Mother    Heart attack Father    Heart attack Brother    Breast cancer Paternal Aunt     Health Maintenance  Topic Date Due   Medicare Annual Wellness (AWV)  Never done   COVID-19 Vaccine (1) Never done   FOOT EXAM  Never done   Diabetic kidney evaluation - Urine ACR  Never done   Hepatitis C Screening  Never done   DTaP/Tdap/Td (4 - Td or Tdap) 11/01/2023   OPHTHALMOLOGY EXAM  01/12/2024 (Originally 08/31/1961)   HEMOGLOBIN A1C  03/22/2024   Diabetic kidney evaluation - eGFR measurement  09/21/2024   MAMMOGRAM  10/18/2025   Colonoscopy  09/27/2030   Pneumonia Vaccine 76+ Years old  Completed   INFLUENZA VACCINE  Completed   DEXA SCAN  Completed   Zoster Vaccines- Shingrix  Completed   HPV VACCINES  Aged Out     ----------------------------------------------------------------------------------------------------------------------------------------------------------------------------------------------------------------- Physical Exam BP 126/81 (BP Location: Left Arm, Patient Position: Sitting, Cuff Size: Large)   Pulse 84   Ht 5\' 2"  (1.575 m)   Wt 173 lb (78.5 kg)   SpO2 100%   BMI 31.64 kg/m   Physical Exam Constitutional:      Appearance: Normal appearance.  HENT:     Head: Normocephalic and atraumatic.  Cardiovascular:     Rate and Rhythm: Normal rate and regular rhythm.  Pulmonary:     Effort:  Pulmonary effort is normal.     Breath sounds: Normal breath sounds.  Neurological:     Mental Status: She is alert.     ------------------------------------------------------------------------------------------------------------------------------------------------------------------------------------------------------------------- Assessment and Plan  Mid back pain She has some tightness over the L paraspinals.  I think this is more than likely MSK in origin, however, with some flank pain as well I am checking renal function, UA and have ordered renal US.  Imaging of the thoracic and lumbar spine ordered as well.  Adding trial of tizanidine.     Meds ordered this encounter  Medications   primidone (MYSOLINE) 50 MG tablet  Sig: Take 2 tablets (100 mg total) by mouth in the morning and at bedtime.    Dispense:  360 tablet    Refill:  1   tiZANidine (ZANAFLEX) 4 MG tablet    Sig: Take 1 tablet (4 mg total) by mouth every 6 (six) hours as needed for muscle spasms.    Dispense:  30 tablet    Refill:  1    No follow-ups on file.    This visit occurred during the SARS-CoV-2 public health emergency.  Safety protocols were in place, including screening questions prior to the visit, additional usage of staff PPE, and extensive cleaning of exam room while observing appropriate contact time as indicated for disinfecting solutions.

## 2023-12-16 LAB — URINALYSIS, ROUTINE W REFLEX MICROSCOPIC
Bilirubin, UA: NEGATIVE
Ketones, UA: NEGATIVE
Nitrite, UA: NEGATIVE
Protein,UA: NEGATIVE
RBC, UA: NEGATIVE
Specific Gravity, UA: 1.017 (ref 1.005–1.030)
Urobilinogen, Ur: 0.2 mg/dL (ref 0.2–1.0)
pH, UA: 5.5 (ref 5.0–7.5)

## 2023-12-16 LAB — MICROSCOPIC EXAMINATION: Casts: NONE SEEN /LPF

## 2023-12-16 LAB — BASIC METABOLIC PANEL
BUN/Creatinine Ratio: 19 (ref 12–28)
BUN: 14 mg/dL (ref 8–27)
CO2: 19 mmol/L — ABNORMAL LOW (ref 20–29)
Calcium: 9.4 mg/dL (ref 8.7–10.3)
Chloride: 103 mmol/L (ref 96–106)
Creatinine, Ser: 0.72 mg/dL (ref 0.57–1.00)
Glucose: 172 mg/dL — ABNORMAL HIGH (ref 70–99)
Potassium: 4.4 mmol/L (ref 3.5–5.2)
Sodium: 139 mmol/L (ref 134–144)
eGFR: 90 mL/min/{1.73_m2} (ref >59)

## 2023-12-18 ENCOUNTER — Encounter: Payer: Self-pay | Admitting: Family Medicine

## 2024-01-06 ENCOUNTER — Encounter: Payer: Self-pay | Admitting: Family Medicine

## 2024-03-22 ENCOUNTER — Ambulatory Visit: Payer: Self-pay

## 2024-03-22 NOTE — Telephone Encounter (Signed)
 FYI Only or Action Required?: FYI only for provider  Patient was last seen in primary care on 12/15/2023 by Adela Holter, DO. Called Nurse Triage reporting Cough. Symptoms began about a month ago. Interventions attempted: Nothing. Symptoms are: gradually worsening.  Triage Disposition: See HCP Within 4 Hours (Or PCP Triage) Out of town today; apt scheduled for tomorrow.   Patient/caregiver understands and will follow disposition?: Yes Reason for Disposition  [1] MILD difficulty breathing (e.g., minimal/no SOB at rest, SOB with walking, pulse <100) AND [2] still present when not coughing  Answer Assessment - Initial Assessment Questions 1. ONSET: When did the cough begin?      A month ago 2. SEVERITY: How bad is the cough today?      severe 3. SPUTUM: Describe the color of your sputum (none, dry cough; clear, white, yellow, green)     yellowish 4. HEMOPTYSIS: Are you coughing up any blood? If so ask: How much? (flecks, streaks, tablespoons, etc.)     no 5. DIFFICULTY BREATHING: Are you having difficulty breathing? If Yes, ask: How bad is it? (e.g., mild, moderate, severe)    - MILD: No SOB at rest, mild SOB with walking, speaks normally in sentences, can lie down, no retractions, pulse < 100.    - MODERATE: SOB at rest, SOB with minimal exertion and prefers to sit, cannot lie down flat, speaks in phrases, mild retractions, audible wheezing, pulse 100-120.    - SEVERE: Very SOB at rest, speaks in single words, struggling to breathe, sitting hunched forward, retractions, pulse > 120      Yes, moderate 6. FEVER: Do you have a fever? If Yes, ask: What is your temperature, how was it measured, and when did it start?     unknown 7. CARDIAC HISTORY: Do you have any history of heart disease? (e.g., heart attack, congestive heart failure)      no 8. LUNG HISTORY: Do you have any history of lung disease?  (e.g., pulmonary embolus, asthma, emphysema)     asthma 9. PE RISK  FACTORS: Do you have a history of blood clots? (or: recent major surgery, recent prolonged travel, bedridden)     no 10. OTHER SYMPTOMS: Do you have any other symptoms? (e.g., runny nose, wheezing, chest pain)       no 11. PREGNANCY: Is there any chance you are pregnant? When was your last menstrual period?       na 12. TRAVEL: Have you traveled out of the country in the last month? (e.g., travel history, exposures)       no  Protocols used: Cough - Acute Productive-A-AH

## 2024-03-22 NOTE — Telephone Encounter (Signed)
 Duplicate NT encounter. See other NT encounter from today.  Copied from CRM [id number]. Topic: Clinical - Red Word Triage >> Mar 22, 2024  2:13 PM Retta Caster wrote: Red Word that prompted transfer to Nurse Triage: Patient Cough for 3 days worse last couple days

## 2024-03-22 NOTE — Telephone Encounter (Signed)
 FYI - the patient has been scheduled with Dr. Elva Hamburger on 03/23/24 at 415 pm for the following symptoms.

## 2024-03-23 ENCOUNTER — Ambulatory Visit (INDEPENDENT_AMBULATORY_CARE_PROVIDER_SITE_OTHER)

## 2024-03-23 ENCOUNTER — Ambulatory Visit (INDEPENDENT_AMBULATORY_CARE_PROVIDER_SITE_OTHER): Admitting: Sports Medicine

## 2024-03-23 VITALS — BP 150/81 | HR 101 | Temp 98.2°F | Wt 170.0 lb

## 2024-03-23 DIAGNOSIS — R062 Wheezing: Secondary | ICD-10-CM

## 2024-03-23 DIAGNOSIS — R052 Subacute cough: Secondary | ICD-10-CM

## 2024-03-23 DIAGNOSIS — R059 Cough, unspecified: Secondary | ICD-10-CM | POA: Diagnosis not present

## 2024-03-23 LAB — POC COVID19 BINAXNOW: SARS Coronavirus 2 Ag: NEGATIVE

## 2024-03-23 LAB — POCT INFLUENZA A/B
Influenza A, POC: NEGATIVE
Influenza B, POC: NEGATIVE

## 2024-03-23 MED ORDER — HYDROCODONE BIT-HOMATROP MBR 5-1.5 MG/5ML PO SOLN: 5.0000 mL | Freq: Three times a day (TID) | ORAL | 0 refills | Status: DC | PRN

## 2024-03-23 MED ORDER — ALBUTEROL SULFATE HFA 108 (90 BASE) MCG/ACT IN AERS
2.0000 | INHALATION_SPRAY | Freq: Four times a day (QID) | RESPIRATORY_TRACT | 11 refills | Status: AC | PRN
Start: 2024-03-23 — End: ?

## 2024-03-23 MED ORDER — HYDROCOD POLI-CHLORPHE POLI ER 10-8 MG/5ML PO SUER
5.0000 mL | Freq: Two times a day (BID) | ORAL | 0 refills | Status: DC | PRN
Start: 2024-03-23 — End: 2024-03-23

## 2024-03-23 MED ORDER — PREDNISONE 50 MG PO TABS: 50.0000 mg | ORAL_TABLET | Freq: Every day | ORAL | 0 refills | Status: DC

## 2024-03-23 MED ORDER — AZITHROMYCIN 250 MG PO TABS: ORAL_TABLET | ORAL | 0 refills | Status: DC

## 2024-03-23 NOTE — Progress Notes (Signed)
    Procedures performed today:    None.  Independent interpretation of notes and tests performed by another provider:   None.  Brief History, Exam, Impression, and Recommendations:    Coughing This is a very pleasant 72 year old female, non-smoker, she does some vaping. For the past 6 weeks per her report she has had a mild cough, worsened over the past several weeks. A family member does have some bronchitis. Unfortunately the cough has worsened to the point where she is coughing consistently and continuously in the exam room. It keeps her up at night. No fevers, chills, muscle aches and body aches, cough is mostly nonproductive, no runny nose, no GI symptoms, no chest pain. Vital signs are normal. She does have inspiratory expiratory wheezing, she is coughing in the exam room but she is able to speak full sentences, oropharyngeal exam is normal. No accessory muscle use or nasal flaring. We will do an albuterol nebulizer treatment here, 5 days of prednisone , albuterol, chest x-ray, azithromycin, she will do absolutely no vaping.  Update: Pharmacy does not have any Tussionex, switching to Hycodan syrup.    ____________________________________________ Joselyn Nicely. Sandy Crumb, M.D., ABFM., CAQSM., AME. Primary Care and Sports Medicine [facility]  Adjunct Professor of [facility] Medicine  [employer]  Restaurant manager, fast food

## 2024-03-23 NOTE — Assessment & Plan Note (Addendum)
 This is a very pleasant 73 year old female, non-smoker, she does some vaping. For the past 6 weeks per her report she has had a mild cough, worsened over the past several weeks. A family member does have some bronchitis. Unfortunately the cough has worsened to the point where she is coughing consistently and continuously in the exam room. It keeps her up at night. No fevers, chills, muscle aches and body aches, cough is mostly nonproductive, no runny nose, no GI symptoms, no chest pain. Vital signs are normal. She does have inspiratory expiratory wheezing, she is coughing in the exam room but she is able to speak full sentences, oropharyngeal exam is normal. No accessory muscle use or nasal flaring. We will do an albuterol nebulizer treatment here, 5 days of prednisone , albuterol, chest x-ray, azithromycin, she will do absolutely no vaping.  Update: Pharmacy does not have any Tussionex, switching to Hycodan syrup.

## 2024-03-27 ENCOUNTER — Ambulatory Visit: Payer: Self-pay | Admitting: Sports Medicine

## 2024-04-06 ENCOUNTER — Ambulatory Visit (INDEPENDENT_AMBULATORY_CARE_PROVIDER_SITE_OTHER): Admitting: Family Medicine

## 2024-04-06 VITALS — BP 147/84 | HR 81 | Ht 62.0 in | Wt 170.0 lb

## 2024-04-06 DIAGNOSIS — G4700 Insomnia, unspecified: Secondary | ICD-10-CM

## 2024-04-06 DIAGNOSIS — G250 Essential tremor: Secondary | ICD-10-CM

## 2024-04-06 DIAGNOSIS — E119 Type 2 diabetes mellitus without complications: Secondary | ICD-10-CM

## 2024-04-06 DIAGNOSIS — G2581 Restless legs syndrome: Secondary | ICD-10-CM

## 2024-04-06 LAB — POCT UA - MICROALBUMIN
Albumin/Creatinine Ratio, Urine, POC: <30
Creatinine, POC: 200 mg/dL
Microalbumin Ur, POC: 30 mg/L

## 2024-04-06 LAB — POCT GLYCOSYLATED HEMOGLOBIN (HGB A1C): HbA1c, POC (controlled diabetic range): 7.3 % — AB (ref 0.0–7.0)

## 2024-04-06 MED ORDER — PRIMIDONE 50 MG PO TABS: 100.0000 mg | ORAL_TABLET | Freq: Two times a day (BID) | ORAL | 1 refills | Status: DC

## 2024-04-06 MED ORDER — GLIPIZIDE ER 10 MG PO TB24: 10.0000 mg | ORAL_TABLET | Freq: Every day | ORAL | 1 refills | Status: AC

## 2024-04-06 MED ORDER — ESZOPICLONE 3 MG PO TABS: 3.0000 mg | ORAL_TABLET | Freq: Every day | ORAL | 1 refills | Status: DC | PRN

## 2024-04-08 ENCOUNTER — Encounter: Payer: Self-pay | Admitting: Family Medicine

## 2024-04-08 NOTE — Assessment & Plan Note (Addendum)
 Stable with Lunesta at 3 mg nightly.  Will plan to continue her current strength.

## 2024-04-08 NOTE — Assessment & Plan Note (Signed)
 She is currently using Neupro patch.  This is working well for her and being managed by neurology.

## 2024-04-08 NOTE — Assessment & Plan Note (Signed)
 Stay with primidone  at current strength.

## 2024-04-08 NOTE — Progress Notes (Signed)
 Carla Estrada - 73 y.o. female MRN [id number]  Date of birth: May 28, 1951  Subjective Chief Complaint  Patient presents with   Hypertension   Diabetes   Cough    HPI Carla Estrada is a 73 year old female here today for follow-up.  Recently seen in clinic for cough which seems to be improving at this point.  Denies dyspnea or wheezing.  Continues on losartan  for management of hypertension.  Blood pressure is well-controlled at this time.  She denies side effects of medication at current strength.  Remains on glipizide for management of diabetes.  A1c today indicates that blood sugars are well-controlled.  She denies symptoms of hypoglycemia.  She has been seeing neurology for restless legs.  Initially tried on Sinemet but this did not work well for her.  Currently on rotigotine patch which seems to be helpful.  Remains on Lunesta as well for insomnia  Essential tremor stable primidone .  ROS:  A comprehensive ROS was completed and negative except as noted per HPI  Allergies  Allergen Reactions   Oxycodone-Acetaminophen Nausea And Vomiting    Other reaction(s): Nausea And Vomiting    Liraglutide Nausea And Vomiting    Other reaction(s): Nausea And Vomiting    Other Nausea And Vomiting   Gabapentin Other (See Comments)    Pt states cant tolerate, tried for restless legs and started having double vision, mental status changes, out of body feeling   Inderal [Propranolol] Other (See Comments)    dizzy   Propoxyphene Nausea Only   Exenatide Nausea And Vomiting, Nausea Only and Other (See Comments)    Per outside record   Zoledronic Acid Nausea Only    Past Medical History:  Diagnosis Date   Allergy    Anemia    Benign head tremor    Cataract    Diabetes mellitus without complication (HCC)    GERD (gastroesophageal reflux disease)    History of kidney stones    Hypertension    Iron deficiency anemia 12/13/2017   Low vitamin B12 level    Neuromuscular disorder  (HCC)    Restless legs    Thyroid  disease    Vitamin D deficiency     Past Surgical History:  Procedure Laterality Date   ABDOMINAL HYSTERECTOMY     BREAST EXCISIONAL BIOPSY Left    cataract surgery  2020   CHOLECYSTECTOMY     COLONOSCOPY N/A 03/17/2021   Procedure: COLONOSCOPY;  Surgeon: Janalyn Keene NOVAK, MD;  Location: [facility] ENDOSCOPY;  Service: Endoscopy;  Laterality: N/A;   COLONOSCOPY WITH PROPOFOL  N/A 01/05/2018   Procedure: COLONOSCOPY WITH PROPOFOL ;  Surgeon: Therisa Bi, MD;  Location: [facility] ENDOSCOPY;  Service: Gastroenterology;  Laterality: N/A;   ESOPHAGOGASTRODUODENOSCOPY (EGD) WITH PROPOFOL  N/A 01/05/2018   Procedure: ESOPHAGOGASTRODUODENOSCOPY (EGD) WITH PROPOFOL ;  Surgeon: Therisa Bi, MD;  Location: [facility] ENDOSCOPY;  Service: Gastroenterology;  Laterality: N/A;   FOOT SURGERY     FOOT SURGERY     GASTRIC BYPASS  2014    Social History   Socioeconomic History   Marital status: Married    Spouse name: Not on file   Number of children: Not on file   Years of education: Not on file   Highest education level: Not on file  Occupational History   Not on file  Tobacco Use   Smoking status: Former    Current packs/day: 0.25    Average packs/day: 0.3 packs/day for 20.0 years (5.0 ttl pk-yrs)    Types: Cigarettes   Smokeless tobacco: Never  Vaping Use   Vaping status: Every Day   Start date: 10/11/2016  Substance and Sexual Activity   Alcohol use: No   Drug use: No   Sexual activity: Yes  Other Topics Concern   Not on file  Social History Narrative   Not on file   Social Drivers of Health   Financial Resource Strain: Low Risk  (12/01/2023)   Received from [facility]   Overall Financial Resource Strain (CARDIA)    Difficulty of Paying Living Expenses: Not very hard  Food Insecurity: No Food Insecurity (12/01/2023)   Received [facility]   Hunger Vital Sign    Within the past 12 months, you worried that your food would run out before you got the  money to buy more.: Never true    Within the past 12 months, the food you bought just didn't last and you didn't have money to get more.: Never true  Transportation Needs: No Transportation Needs (12/01/2023)   Received from [facility] - Transportation    Lack of Transportation (Medical): No    Lack of Transportation (Non-Medical): No  Physical Activity: Unknown (12/01/2023)   Received from [facility]   Exercise Vital Sign    On average, how many days per week do you engage in moderate to strenuous exercise (like a brisk walk)?: 0 days    Minutes of Exercise per Session: Not on file  Stress: No Stress Concern Present (12/01/2023)   Received from [facility]ational [facility] - Occupational Stress Questionnaire    Feeling of Stress : Only a little  Social Connections: Moderately Integrated (12/01/2023)   Received from [facility]   Social Network    How would you rate your social network (family, work, friends)?: Adequate participation with social networks    Family History  Problem Relation Age of Onset   Diabetes Mother    Heart attack Father    Heart attack Brother    Breast cancer Paternal Aunt     Health Maintenance  Topic Date Due   Medicare Annual Wellness (AWV)  Never done   COVID-19 Vaccine (1) Never done   OPHTHALMOLOGY EXAM  Never done   Hepatitis C Screening  Never done   Hepatitis B Vaccines (1 of 3 - Risk 3-dose series) Never done   DTaP/Tdap/Td (4 - Td or Tdap) 11/01/2023   INFLUENZA VACCINE  05/11/2024   HEMOGLOBIN A1C  10/06/2024   Diabetic kidney evaluation - eGFR measurement  12/14/2024   Diabetic kidney evaluation - Urine ACR  04/06/2025   FOOT EXAM  04/06/2025   MAMMOGRAM  10/18/2025   Colonoscopy  09/27/2030   Pneumococcal Vaccine: 50+ Years  Completed   DEXA SCAN  Completed   Zoster Vaccines- Shingrix  Completed   HPV VACCINES  Aged Out   Meningococcal B Vaccine  Aged Out      ----------------------------------------------------------------------------------------------------------------------------------------------------------------------------------------------------------------- Physical Exam BP (!) 147/84 (BP Location: Left Arm, Patient Position: Sitting, Cuff Size: Large)   Pulse 81   Ht 5' 2 (1.575 m)   Wt 170 lb (77.1 kg)   SpO2 99%   BMI 31.09 kg/m   Physical Exam Constitutional:      Appearance: Normal appearance.  HENT:     Head: Normocephalic and atraumatic.   Eyes:     General: No scleral icterus.   Cardiovascular:     Rate and Rhythm: Normal rate and regular rhythm.  Pulmonary:     Effort: Pulmonary  effort is normal.     Breath sounds: Normal breath sounds.   Musculoskeletal:     Cervical back: Neck supple.   Neurological:     Mental Status: She is alert.   Psychiatric:        Mood and Affect: Mood normal.        Behavior: Behavior normal.     ------------------------------------------------------------------------------------------------------------------------------------------------------------------------------------------------------------------- Assessment and Plan  Diabetes mellitus, type II (HCC) Lab Results  Component Value Date   HGBA1C 7.3 (A) 04/06/2024  Blood sugars remain fairly well-controlled.  She will continue glipizide at current strength.  Dietary changes encouraged.  Essential tremor Stay with primidone  at current strength.  Insomnia Stable with Lunesta at 3 mg nightly.  Will plan to continue her current strength.  Restless legs syndrome She is currently using Neupro patch.  This is working well for her and being managed by neurology.   Meds ordered this encounter  Medications   Eszopiclone 3 MG TABS    Sig: Take 1 tablet (3 mg total) by mouth daily as needed.    Dispense:  90 tablet    Refill:  1   primidone  (MYSOLINE ) 50 MG tablet    Sig: Take 2 tablets (100 mg total) by mouth in  the morning and at bedtime.    Dispense:  360 tablet    Refill:  1   glipiZIDE (GLUCOTROL XL) 10 MG 24 hr tablet    Sig: Take 1 tablet (10 mg total) by mouth daily.    Dispense:  90 tablet    Refill:  1    Return in about 6 months (around 10/06/2024) for Type 2 Diabetes, Hypertension.

## 2024-04-08 NOTE — Assessment & Plan Note (Signed)
 Lab Results  Component Value Date   HGBA1C 7.3 (A) 04/06/2024  Blood sugars remain fairly well-controlled.  She will continue glipizide at current strength.  Dietary changes encouraged.

## 2024-05-28 ENCOUNTER — Other Ambulatory Visit: Payer: Self-pay | Admitting: Family Medicine

## 2024-05-28 NOTE — Telephone Encounter (Unsigned)
 Copied from CRM [id number]. Topic: Clinical - Medication Refill >> May 28, 2024  9:39 AM Diannia H wrote: Medication: betamethasone  dipropionate (DIPROLENE ) 0.05 % ointment, Neupro   Has the patient contacted their pharmacy? Yes (Agent: If no, request that the patient contact the pharmacy for the refill. If patient does not wish to contact the pharmacy document the reason why and proceed with request.) (Agent: If yes, when and what did the pharmacy advise?)  This is the patient's preferred pharmacy:  [id number] - [place], [place] - [address] [place] [address] Phone: [phone] Fax: [phone]  Is this the correct pharmacy for this prescription? Yes If no, delete pharmacy and type the correct one.   Has the prescription been filled recently? No  Is the patient out of the medication? Yes  Has the patient been seen for an appointment in the last year OR does the patient have an upcoming appointment? Yes  Can we respond through MyChart? Yes  Agent: Please be advised that Rx refills may take up to 3 business days. We ask that you follow-up with your pharmacy.

## 2024-05-31 MED ORDER — BETAMETHASONE DIPROPIONATE 0.05 % EX CREA: TOPICAL_CREAM | Freq: Two times a day (BID) | CUTANEOUS | 1 refills | Status: AC | PRN

## 2024-06-12 ENCOUNTER — Encounter: Payer: Self-pay | Admitting: Sports Medicine

## 2024-07-06 ENCOUNTER — Telehealth: Payer: Self-pay | Admitting: Family Medicine

## 2024-07-06 DIAGNOSIS — G3184 Mild cognitive impairment, so stated: Secondary | ICD-10-CM

## 2024-07-06 NOTE — Telephone Encounter (Signed)
 Copied from CRM #[id number]. Topic: Referral - Question >> Jul 06, 2024 10:42 AM Antwanette L wrote: Reason for CRM: The patient was seen by Dr. Fonda Minus, neurologist at [facility], on 9/18 and was diagnosed with Alzheimer's . Dr. Minus referred the patient to another office; however, the earliest available appointment is in April 2026. The patient didn't have that office information at the time of call and will call back with it. The patient is requesting to know if Dr. Alvia can provide a referral to an alternative office with earlier availability.

## 2024-08-13 ENCOUNTER — Encounter: Payer: Self-pay | Admitting: Family Medicine

## 2024-08-14 MED ORDER — OMEPRAZOLE 40 MG PO CPDR: 40.0000 mg | DELAYED_RELEASE_CAPSULE | Freq: Every day | ORAL | 1 refills | Status: AC

## 2024-08-14 NOTE — Telephone Encounter (Signed)
 Patient requesting rx rf of Omeprazole with strength increase to 40mg   Last written 01/10/2017 Last OV 04/06/2024 Upcoming appt 10/08/2024

## 2024-08-23 ENCOUNTER — Telehealth: Payer: Self-pay | Admitting: Family Medicine

## 2024-08-23 NOTE — Telephone Encounter (Signed)
 Contacted patient regarding imaging orders. Patient was frustrated that she was unable to find where her appointment was scheduled today. Carla Estrada states that labs and imaging was ordered.  After review of referrals and clinical notes in care everywhere. Found that Dr. Megan ordered labs and imaging through [facility]. Patient was given scheduled appointment  08/24/2024 at 11:40 am [facility] [address] [place], [place] [address]  [phone]  Patient is very appreciative.

## 2024-08-23 NOTE — Telephone Encounter (Signed)
 Patient said she was told in imaging there were no orders for the tests she was inquiring about

## 2024-08-23 NOTE — Telephone Encounter (Signed)
 Copied from CRM [id number]. Topic: Appointments - Scheduling Inquiry for Clinic >> Aug 23, 2024 10:22 AM Antwanette L wrote: Reason for CRM: The patient stated she was scheduled for a bone density scan and mammogram today at 10:00 AM. However, upon arriving downstairs, she was informed that no appointment was on record. The patient requests to be rescheduled for both services. The patient is requesting a callback at [phone]

## 2024-10-08 ENCOUNTER — Encounter: Payer: Self-pay | Admitting: Family Medicine

## 2024-10-08 ENCOUNTER — Ambulatory Visit: Admitting: Family Medicine

## 2024-10-08 VITALS — BP 166/84 | HR 75 | Ht 62.0 in | Wt 176.0 lb

## 2024-10-08 DIAGNOSIS — I10 Essential (primary) hypertension: Secondary | ICD-10-CM | POA: Diagnosis not present

## 2024-10-08 DIAGNOSIS — E559 Vitamin D deficiency, unspecified: Secondary | ICD-10-CM

## 2024-10-08 DIAGNOSIS — E119 Type 2 diabetes mellitus without complications: Secondary | ICD-10-CM

## 2024-10-08 DIAGNOSIS — R4189 Other symptoms and signs involving cognitive functions and awareness: Secondary | ICD-10-CM

## 2024-10-08 DIAGNOSIS — Z23 Encounter for immunization: Secondary | ICD-10-CM

## 2024-10-08 DIAGNOSIS — E538 Deficiency of other specified B group vitamins: Secondary | ICD-10-CM | POA: Diagnosis not present

## 2024-10-08 LAB — POCT GLYCOSYLATED HEMOGLOBIN (HGB A1C): Hemoglobin A1C: 7 % — AB (ref 4.0–5.6)

## 2024-10-08 MED ORDER — MIRTAZAPINE 15 MG PO TABS: 15.0000 mg | ORAL_TABLET | Freq: Every day | ORAL | 0 refills | Status: AC

## 2024-10-08 NOTE — Assessment & Plan Note (Signed)
Update B12 levels.  

## 2024-10-08 NOTE — Assessment & Plan Note (Signed)
 Continues to have significant cognitive change.  Optimize B12 levels.   She has been referred to memory assessment clinic for further management as well.  She will follow-up with her regular neurologist as well as to discuss adjustment in primidone  and/or discontinuation of Neupro patch.  She has had some sleep disturbance, mostly with difficulty falling asleep.  Will provide trial of mirtazapine to replace Lunesta  and this may also contribute to her cognitive deficit.

## 2024-10-08 NOTE — Patient Instructions (Signed)
 Try Mirtazapine to replace Lunesta  for sleep.  This may help some with appetite as well.

## 2024-10-08 NOTE — Assessment & Plan Note (Signed)
 Blood pressure is elevated.  Continue losartan  at current strength.  Return in approximately 2 weeks for blood pressure recheck.

## 2024-10-08 NOTE — Assessment & Plan Note (Signed)
Update vitamin D. 

## 2024-10-08 NOTE — Assessment & Plan Note (Signed)
 Lab Results  Component Value Date   HGBA1C 7.0 (A) 10/08/2024  Blood sugars remain fairly well-controlled.  She will continue glipizide  at current strength.  Dietary changes encouraged.

## 2024-10-08 NOTE — Progress Notes (Signed)
 " Nuriya Stuck - 73 y.o. female MRN [id number]  Date of birth: [birth year]  Subjective Chief Complaint  Patient presents with   Medical Management of Chronic Issues    DM / HTN    HPI Ladeana Laplant is a 73 y.o. female here today for follow up visit.   She remains on glipizide .  Doing well with this and denies hypoglycemia.  She is not monitoring blood sugars at home.  A1c 7.0% today.  This is improved from previous visit. \ Blood pressure is well-controlled with losartan .  No side effects at current strength.  Has not had chest pain, shortness of breath, palpitations, headaches or vision changes.   Has had some declining memory over the past year.  Seen by neurology/geriatrics at [facility].  She did have genetic testing consistent with Alzheimer's.  She has been referred to memory assessment clinic through [facility].  Recommended continuation of B12 and increasing Namenda to 10 mg twice daily.  She is on primidone  and Neupro patch due to tremor.  It was recommended that she discuss changing this with her neurologist due to this potentially worsening her cognitive symptoms.  Husband is noticed that her appetite seems decreased.  She is interested in possibly coming off of Lunesta .  She does have some sleep difficulty.  No difficulty falling asleep.  When she is asleep she is able to stay asleep.  No significant nighttime sleep disturbances.  ROS:  A comprehensive ROS was completed and negative except as noted per HPI  Allergies[1]  Past Medical History:  Diagnosis Date   Allergy    Anemia    Benign head tremor    Cataract    Diabetes mellitus without complication (HCC)    GERD (gastroesophageal reflux disease)    History of kidney stones    Hypertension    Iron deficiency anemia 12/13/2017   Low vitamin B12 level    Neuromuscular disorder (HCC)    Restless legs    Thyroid  disease    Vitamin D deficiency     Past Surgical History:  Procedure Laterality Date   ABDOMINAL  HYSTERECTOMY     BREAST EXCISIONAL BIOPSY Left    cataract surgery  2020   CHOLECYSTECTOMY     COLONOSCOPY N/A 03/17/2021   Procedure: COLONOSCOPY;  Surgeon: Janalyn Keene NOVAK, MD;  Location: [facility] ENDOSCOPY;  Service: Endoscopy;  Laterality: N/A;   COLONOSCOPY WITH PROPOFOL  N/A 01/05/2018   Procedure: COLONOSCOPY WITH PROPOFOL ;  Surgeon: Therisa Bi, MD;  Location: [facility] ENDOSCOPY;  Service: Gastroenterology;  Laterality: N/A;   ESOPHAGOGASTRODUODENOSCOPY (EGD) WITH PROPOFOL  N/A 01/05/2018   Procedure: ESOPHAGOGASTRODUODENOSCOPY (EGD) WITH PROPOFOL ;  Surgeon: Therisa Bi, MD;  Location: [facility] ENDOSCOPY;  Service: Gastroenterology;  Laterality: N/A;   FOOT SURGERY     FOOT SURGERY     GASTRIC BYPASS  2014    Social History   Socioeconomic History   Marital status: Married    Spouse name: Not on file   Number of children: Not on file   Years of education: Not on file   Highest education level: Not on file  Occupational History   Not on file  Tobacco Use   Smoking status: Former    Current packs/day: 0.25    Average packs/day: 0.3 packs/day for 20.0 years (5.0 ttl pk-yrs)    Types: Cigarettes   Smokeless tobacco: Never  Vaping Use   Vaping status: Every Day   Start date: 10/11/2016  Substance and Sexual Activity   Alcohol use: No  Drug use: No   Sexual activity: Yes  Other Topics Concern   Not on file  Social History Narrative   Not on file   Social Drivers of Health   Tobacco Use: Medium Risk (10/08/2024)   Patient History    Smoking Tobacco Use: Former    Smokeless Tobacco Use: Never    Passive Exposure: Not on file  Financial Resource Strain: Low Risk (06/05/2024)   Received from [facility]   Overall Financial Resource Strain (CARDIA)    How hard is it for you to pay for the very basics like food, housing, medical care, and heating?: Not very hard  Food Insecurity: No Food Insecurity (06/05/2024)   Received from [facility]   Epic    Within the past 12 months,  you worried that your food would run out before you got the money to buy more.: Never true    Within the past 12 months, the food you bought just didn't last and you didn't have money to get more.: Never true  Transportation Needs: No Transportation Needs (06/05/2024)   Received from [facility]   Epic    In the past 12 months, has lack of transportation kept you from medical appointments or from getting medications?: No    In the past 12 months, has lack of transportation kept you from meetings, work, or from getting things needed for daily living?: No  Physical Activity: Inactive (06/05/2024)   Received from [facility]   Exercise Vital Sign    On average, how many days per week do you engage in moderate to strenuous exercise (like a brisk walk)?: 0 days    Minutes of Exercise per Session: Not on file  Stress: No Stress Concern Present (06/05/2024)   Received from [facility] of Occupational Health - Occupational Stress Questionnaire    Do you feel stress - tense, restless, nervous, or anxious, or unable to sleep at night because your mind is troubled all the time - these days?: Only a little  Social Connections: Socially Integrated (06/05/2024)   Received from [facility]   Social Network    How would you rate your social network (family, work, friends)?: Good participation with social networks  Depression (PHQ2-9): Low Risk (10/08/2024)   Depression (PHQ2-9)    PHQ-2 Score: 4  Alcohol Screen: Not on file  Housing: Low Risk (06/05/2024)   Received from [facility]    In the last 12 months, was there a time when you were not able to pay the mortgage or rent on time?: No    In the past 12 months, how many times have you moved where you were living?: 0    At any time in the past 12 months, were you homeless or living in a shelter (including now)?: No  Utilities: Not At Risk (06/05/2024)   Received from [facility]    In the past 12 months has  the electric, gas, oil, or water company threatened to shut off services in your home?: No  Health Literacy: Not on file    Family History  Problem Relation Age of Onset   Diabetes Mother    Heart attack Father    Heart attack Brother    Breast cancer Paternal Aunt     Health Maintenance  Topic Date Due   Medicare Annual Wellness (AWV)  Never done   COVID-19 Vaccine (1) Never done   OPHTHALMOLOGY EXAM  Never  done   Hepatitis C Screening  Never done   DTaP/Tdap/Td (4 - Td or Tdap) 11/01/2023   Diabetic kidney evaluation - eGFR measurement  12/14/2024   Diabetic kidney evaluation - Urine ACR  04/06/2025   FOOT EXAM  04/06/2025   HEMOGLOBIN A1C  04/08/2025   Mammogram  10/18/2025   Colonoscopy  09/27/2030   Pneumococcal Vaccine: 50+ Years  Completed   Influenza Vaccine  Completed   Bone Density Scan  Completed   Zoster Vaccines- Shingrix  Completed   Meningococcal B Vaccine  Aged Out     ----------------------------------------------------------------------------------------------------------------------------------------------------------------------------------------------------------------- Physical Exam BP (!) 166/84 (BP Location: Right Arm, Patient Position: Sitting, Cuff Size: Normal)   Pulse 75   Ht 5' 2 (1.575 m)   Wt 176 lb (79.8 kg)   SpO2 100%   BMI 32.19 kg/m   Physical Exam Constitutional:      Appearance: Normal appearance.  Eyes:     General: No scleral icterus. Cardiovascular:     Rate and Rhythm: Normal rate and regular rhythm.  Pulmonary:     Effort: Pulmonary effort is normal.     Breath sounds: Normal breath sounds.  Musculoskeletal:     Cervical back: Neck supple.  Neurological:     Mental Status: She is alert.  Psychiatric:        Mood and Affect: Mood normal.        Behavior: Behavior normal.      ------------------------------------------------------------------------------------------------------------------------------------------------------------------------------------------------------------------- Assessment and Plan  Cobalamin deficiency Update B12 levels.  Cognitive change Continues to have significant cognitive change.  Optimize B12 levels.   She has been referred to memory assessment clinic for further management as well.  She will follow-up with her regular neurologist as well as to discuss adjustment in primidone  and/or discontinuation of Neupro patch.  She has had some sleep disturbance, mostly with difficulty falling asleep.  Will provide trial of mirtazapine to replace Lunesta  and this may also contribute to her cognitive deficit.  Diabetes mellitus, type II (HCC) Lab Results  Component Value Date   HGBA1C 7.0 (A) 10/08/2024  Blood sugars remain fairly well-controlled.  She will continue glipizide  at current strength.  Dietary changes encouraged.  Vitamin D deficiency Update vitamin D.  Hypertension Blood pressure is elevated.  Continue losartan  at current strength.  Return in approximately 2 weeks for blood pressure recheck.   Meds ordered this encounter  Medications   mirtazapine (REMERON) 15 MG tablet    Sig: Take 1 tablet (15 mg total) by mouth at bedtime.    Dispense:  90 tablet    Refill:  0    Return in about 6 weeks (around 11/19/2024) for F/u Insomnia..         [1]  Allergies Allergen Reactions   Oxycodone-Acetaminophen Nausea And Vomiting    Other reaction(s): Nausea And Vomiting    Liraglutide Nausea And Vomiting    Other reaction(s): Nausea And Vomiting    Other Nausea And Vomiting   Gabapentin Other (See Comments)    Pt states cant tolerate, tried for restless legs and started having double vision, mental status changes, out of body feeling   Inderal [Propranolol] Other (See Comments)    dizzy   Propoxyphene Nausea Only    Exenatide Nausea And Vomiting, Nausea Only and Other (See Comments)    Per outside record   Zoledronic Acid Nausea Only   "

## 2024-10-09 LAB — CMP14+EGFR
ALT: 13 IU/L (ref 0–32)
AST: 23 IU/L (ref 0–40)
Albumin: 4.1 g/dL (ref 3.8–4.8)
Alkaline Phosphatase: 132 IU/L (ref 49–135)
BUN/Creatinine Ratio: 9 — ABNORMAL LOW (ref 12–28)
BUN: 6 mg/dL — ABNORMAL LOW (ref 8–27)
Bilirubin Total: 0.3 mg/dL (ref 0.0–1.2)
CO2: 18 mmol/L — ABNORMAL LOW (ref 20–29)
Calcium: 8.8 mg/dL (ref 8.7–10.3)
Chloride: 102 mmol/L (ref 96–106)
Creatinine, Ser: 0.7 mg/dL (ref 0.57–1.00)
Globulin, Total: 2.7 g/dL (ref 1.5–4.5)
Glucose: 115 mg/dL — ABNORMAL HIGH (ref 70–99)
Potassium: 3.8 mmol/L (ref 3.5–5.2)
Sodium: 139 mmol/L (ref 134–144)
Total Protein: 6.8 g/dL (ref 6.0–8.5)
eGFR: 92 mL/min/1.73

## 2024-10-09 LAB — CBC WITH DIFFERENTIAL/PLATELET
Basophils Absolute: 0.1 x10E3/uL (ref 0.0–0.2)
Basos: 2 %
EOS (ABSOLUTE): 0.2 x10E3/uL (ref 0.0–0.4)
Eos: 5 %
Hematocrit: 35.7 % (ref 34.0–46.6)
Hemoglobin: 11.2 g/dL (ref 11.1–15.9)
Immature Grans (Abs): 0 x10E3/uL (ref 0.0–0.1)
Immature Granulocytes: 0 %
Lymphocytes Absolute: 1.8 x10E3/uL (ref 0.7–3.1)
Lymphs: 38 %
MCH: 26.4 pg — ABNORMAL LOW (ref 26.6–33.0)
MCHC: 31.4 g/dL — ABNORMAL LOW (ref 31.5–35.7)
MCV: 84 fL (ref 79–97)
Monocytes Absolute: 0.3 x10E3/uL (ref 0.1–0.9)
Monocytes: 7 %
Neutrophils Absolute: 2.3 x10E3/uL (ref 1.4–7.0)
Neutrophils: 48 %
Platelets: 326 x10E3/uL (ref 150–450)
RBC: 4.25 x10E6/uL (ref 3.77–5.28)
RDW: 14.5 % (ref 11.7–15.4)
WBC: 4.7 x10E3/uL (ref 3.4–10.8)

## 2024-10-09 LAB — LIPID PANEL WITH LDL/HDL RATIO
Cholesterol, Total: 205 mg/dL — ABNORMAL HIGH (ref 100–199)
HDL: 58 mg/dL
LDL Chol Calc (NIH): 118 mg/dL — ABNORMAL HIGH (ref 0–99)
LDL/HDL Ratio: 2 ratio (ref 0.0–3.2)
Triglycerides: 166 mg/dL — ABNORMAL HIGH (ref 0–149)
VLDL Cholesterol Cal: 29 mg/dL (ref 5–40)

## 2024-10-09 LAB — VITAMIN D 25 HYDROXY (VIT D DEFICIENCY, FRACTURES): Vit D, 25-Hydroxy: 34.2 ng/mL (ref 30.0–100.0)

## 2024-10-09 LAB — VITAMIN B12: Vitamin B-12: 339 pg/mL (ref 232–1245)

## 2024-10-19 ENCOUNTER — Ambulatory Visit: Payer: Self-pay | Admitting: Family Medicine

## 2024-10-20 ENCOUNTER — Encounter: Payer: Self-pay | Admitting: Family Medicine

## 2024-10-22 ENCOUNTER — Other Ambulatory Visit: Payer: Self-pay

## 2024-10-22 MED ORDER — CYANOCOBALAMIN 1000 MCG/ML IJ SOLN: 1000.0000 ug | INTRAMUSCULAR | 11 refills | Status: AC

## 2024-10-22 NOTE — Telephone Encounter (Signed)
 Requesting B12 injection Last written 1/31/20218 by historical provider  Last OV 10/08/2025 Upcoming 11/19/2024

## 2024-10-24 NOTE — Addendum Note (Signed)
 Addended by: Londa Mackowski P on: 10/24/2024 05:06 PM   Modules accepted: Orders

## 2024-10-24 NOTE — Telephone Encounter (Signed)
 Pended prescription for syringes and needles patient needs for B12 injection.  Please review this to see that this is what you would like to order for the patient.

## 2024-10-26 MED ORDER — "SYRINGE/NEEDLE (DISP) 25G X 1"" 3 ML MISC": 1 refills | Status: AC

## 2024-10-29 ENCOUNTER — Other Ambulatory Visit: Payer: Self-pay

## 2024-10-29 DIAGNOSIS — I10 Essential (primary) hypertension: Secondary | ICD-10-CM

## 2024-10-29 MED ORDER — LOSARTAN POTASSIUM 100 MG PO TABS: 100.0000 mg | ORAL_TABLET | Freq: Every day | ORAL | 0 refills | Status: AC

## 2024-11-14 ENCOUNTER — Other Ambulatory Visit: Payer: Self-pay

## 2024-11-14 MED ORDER — PRIMIDONE 50 MG PO TABS: 100.0000 mg | ORAL_TABLET | Freq: Two times a day (BID) | ORAL | 1 refills | Status: AC

## 2024-11-16 ENCOUNTER — Other Ambulatory Visit: Payer: Self-pay

## 2024-11-19 ENCOUNTER — Ambulatory Visit: Admitting: Family Medicine
